# Patient Record
Sex: Female | Born: 1935 | Race: White | Hispanic: No | Smoking: Former smoker
Health system: Southern US, Community
[De-identification: ages and names within clinical notes are randomized; demographics above are authoritative.]

## PROBLEM LIST (undated history)

## (undated) DIAGNOSIS — I10 Essential (primary) hypertension: Secondary | ICD-10-CM

## (undated) DIAGNOSIS — F0390 Unspecified dementia without behavioral disturbance: Secondary | ICD-10-CM

## (undated) DIAGNOSIS — M48061 Spinal stenosis, lumbar region without neurogenic claudication: Secondary | ICD-10-CM

## (undated) DIAGNOSIS — M1990 Unspecified osteoarthritis, unspecified site: Secondary | ICD-10-CM

## (undated) DIAGNOSIS — K5790 Diverticulosis of intestine, part unspecified, without perforation or abscess without bleeding: Secondary | ICD-10-CM

## (undated) DIAGNOSIS — N281 Cyst of kidney, acquired: Secondary | ICD-10-CM

## (undated) DIAGNOSIS — I4510 Unspecified right bundle-branch block: Secondary | ICD-10-CM

## (undated) DIAGNOSIS — M51369 Other intervertebral disc degeneration, lumbar region without mention of lumbar back pain or lower extremity pain: Secondary | ICD-10-CM

## (undated) DIAGNOSIS — E119 Type 2 diabetes mellitus without complications: Secondary | ICD-10-CM

## (undated) DIAGNOSIS — M5136 Other intervertebral disc degeneration, lumbar region: Secondary | ICD-10-CM

## (undated) DIAGNOSIS — J189 Pneumonia, unspecified organism: Secondary | ICD-10-CM

## (undated) DIAGNOSIS — R1031 Right lower quadrant pain: Principal | ICD-10-CM

## (undated) HISTORY — DX: Right lower quadrant pain: R10.31

## (undated) HISTORY — PX: INNER EAR SURGERY: SHX679

## (undated) HISTORY — DX: Unspecified osteoarthritis, unspecified site: M19.90

## (undated) HISTORY — PX: ABDOMINAL HYSTERECTOMY: SHX81

---

## 2001-09-19 ENCOUNTER — Ambulatory Visit (HOSPITAL_COMMUNITY): Admission: RE | Admit: 2001-09-19 | Discharge: 2001-09-19 | Payer: Self-pay | Admitting: Family Medicine

## 2001-09-19 ENCOUNTER — Encounter: Payer: Self-pay | Admitting: Family Medicine

## 2003-02-21 ENCOUNTER — Encounter: Payer: Self-pay | Admitting: Family Medicine

## 2003-02-21 ENCOUNTER — Ambulatory Visit (HOSPITAL_COMMUNITY): Admission: RE | Admit: 2003-02-21 | Discharge: 2003-02-21 | Payer: Self-pay | Admitting: Family Medicine

## 2005-12-25 ENCOUNTER — Encounter: Payer: Self-pay | Admitting: Orthopedic Surgery

## 2006-01-07 ENCOUNTER — Encounter: Payer: Self-pay | Admitting: Orthopedic Surgery

## 2012-02-20 ENCOUNTER — Emergency Department (HOSPITAL_COMMUNITY)
Admission: EM | Admit: 2012-02-20 | Discharge: 2012-02-21 | Disposition: A | Discharge status: Home / Self Care | Payer: Medicare PPO | Attending: Emergency Medicine | Admitting: Emergency Medicine

## 2012-02-20 ENCOUNTER — Emergency Department (HOSPITAL_COMMUNITY): Payer: Medicare PPO

## 2012-02-20 ENCOUNTER — Encounter (HOSPITAL_COMMUNITY): Payer: Self-pay | Admitting: *Deleted

## 2012-02-20 DIAGNOSIS — M795 Residual foreign body in soft tissue: Secondary | ICD-10-CM

## 2012-02-20 DIAGNOSIS — X58XXXA Exposure to other specified factors, initial encounter: Secondary | ICD-10-CM | POA: Insufficient documentation

## 2012-02-20 DIAGNOSIS — Y93H2 Activity, gardening and landscaping: Secondary | ICD-10-CM | POA: Insufficient documentation

## 2012-02-20 DIAGNOSIS — S61209A Unspecified open wound of unspecified finger without damage to nail, initial encounter: Secondary | ICD-10-CM | POA: Insufficient documentation

## 2012-02-20 DIAGNOSIS — Z1833 Retained wood fragments: Secondary | ICD-10-CM | POA: Insufficient documentation

## 2012-02-20 DIAGNOSIS — Z23 Encounter for immunization: Secondary | ICD-10-CM | POA: Insufficient documentation

## 2012-02-20 MED ORDER — TETANUS-DIPHTH-ACELL PERTUSSIS 5-2.5-18.5 LF-MCG/0.5 IM SUSP
0.5000 mL | Freq: Once | INTRAMUSCULAR | Status: AC
  Administered 2012-02-20: 0.5 mL via INTRAMUSCULAR
  Filled 2012-02-20: qty 0.5

## 2012-02-20 NOTE — ED Notes (Signed)
Pt waiting on dr. Hilda Lias to call

## 2012-02-20 NOTE — ED Notes (Signed)
Pt pulling on a vine at home and and vine snapped the other way and pt had a tight grip on the vine and now has part of vine in right third finger

## 2012-02-21 MED ORDER — CEPHALEXIN 500 MG PO CAPS: 1000.0000 mg | ORAL_CAPSULE | Freq: Once | ORAL | Status: DC

## 2012-02-21 MED ORDER — CEPHALEXIN 250 MG/5ML PO SUSR: 500.0000 mg | Freq: Once | ORAL | Status: DC

## 2012-02-21 MED ORDER — LIDOCAINE HCL (PF) 1 % IJ SOLN
INTRAMUSCULAR | Status: AC
  Filled 2012-02-21: qty 10

## 2012-02-21 MED ORDER — CEPHALEXIN 500 MG PO CAPS
ORAL_CAPSULE | ORAL | Status: AC
  Filled 2012-02-21: qty 2

## 2012-02-21 NOTE — Consult Note (Signed)
Reason for Consult: Foreign body right long finger Referring Physician: ER  Haley Nguyen is an 78 y.o. female.  HPI: She was working with grape vine around 4:30 pm yesterday afternoon.  She had a splinter from the vine enter her right dominant ring finger.  It went into the middle phalanx area between the DIP and PIP joints.  It exited.  The family pulled it and only a part of it came out and the rest broke off under the skin.  They came here after a while at home debating what to do.  She has no other injury.  She has no numbness. She is very nervous about it.  History reviewed. No pertinent past medical history.  Past Surgical History  Procedure Date  . Abdominal hysterectomy   . Breast surgery   . Brain surgery     History reviewed. No pertinent family history.  Social History:  reports that she has quit smoking. Her smoking use included Cigarettes. She does not have any smokeless tobacco history on file. She reports that she does not drink alcohol or use illicit drugs.  Allergies: No Known Allergies  Medications: I have reviewed the patient's current medications.  No results found for this or any previous visit (from the past 48 hour(s)).  Dg Finger Middle Right  02/21/2012  *RADIOLOGY REPORT*  Clinical Data: Swelling/laceration to right middle finger  RIGHT MIDDLE FINGER 2+V  Comparison: None.  Findings: No fracture or dislocation is seen.  Mild degenerative changes at the DIP joint.  Mild soft tissue swelling.  No radiopaque foreign body is seen.  IMPRESSION: No fracture, dislocation, or radiopaque foreign body is seen.  Mild soft tissue swelling.  Original Report Authenticated By: Charline Bills, M.D.    Review of Systems  Constitutional: Negative.   HENT: Negative.   Eyes: Negative.   Respiratory: Negative.   Cardiovascular: Negative.   Gastrointestinal: Negative.   Genitourinary: Negative.   Musculoskeletal: Positive for joint pain (She has foreign body right  long finger from grape vine spine).  Skin: Negative.   Neurological: Negative.   Psychiatric/Behavioral: Negative.    Blood pressure 146/54, pulse 70, temperature 97.9 F (36.6 C), temperature source Oral, resp. rate 16, height 5\' 6"  (1.676 m), weight 77.111 kg (170 lb), SpO2 100.00%. Physical Exam  Constitutional: She is oriented to person, place, and time. She appears well-developed and well-nourished.  HENT:  Head: Normocephalic.  Eyes: Conjunctivae and EOM are normal. Pupils are equal, round, and reactive to light.  Neck: Normal range of motion. Neck supple.  Cardiovascular: Normal rate and regular rhythm.   Respiratory: Effort normal and breath sounds normal.  GI: Soft.  Musculoskeletal: She exhibits tenderness (right long finger volar side between PIP and DIP joints with puncture wounds and swelling and tenderness.  Impression of something  deep under skin is present.  NV is intact.  ROM is painful.  She is very upset.).       Hands: Neurological: She is alert and oriented to person, place, and time. She has normal reflexes.  Skin: Skin is warm and dry.  Psychiatric: She has a normal mood and affect. Her behavior is normal. Judgment and thought content normal.    Assessment/Plan: Foreign body of the right long finger volar side.  I explained need to remove the foreign body.  I explained the procedure.  She appeared to understand.  Family members were present.  Sterile prep and drape was done.  1% Xylocaine digital block of the long  finger on the right was done.  A finger tourniquet was applied.  I made an incision on the volar side of the finger between both puncture marks.  With careful dissection, foreign material of wood looking substance seen.  Several pieces were removed.  One was very large, the others, deep next to the tendon, were removed.  The finger was debrieded.  It was washed with peroxide and betadine.  The wound was closed with 3-0 nylon.  A sterile dressing applied  then a dressing of tube gauze applied.  She tolerated it well.  I have told her that a small piece of wood could still remain and the finger may "fester up" later.  If so, she would need further surgery.  She understands.  I will give Keflex tonight.  She has Tramadol at home and she will use this for pain.  I will see her in my office Monday.  Call for appointment.  Return to the ER if any problem.  Rx for Keflex given also.  She understands.  Haley Nguyen 02/21/2012, 1:25 AM

## 2012-02-21 NOTE — ED Provider Notes (Signed)
Medical screening examination/treatment/procedure(s) were conducted as a shared visit with non-physician practitioner(s) and myself.  I personally evaluated the patient during the encounter  Patient seen by me clinically evidence of a woody foreign body deep into the right middle finger. Orthopedics called in consultation Dr. Hilda Lias will come in and remove the foreign body.  Shelda Jakes, MD 02/21/12 [phone]

## 2012-02-21 NOTE — ED Notes (Signed)
Dr. Hilda Lias coming to see pt.

## 2012-02-21 NOTE — ED Provider Notes (Signed)
History     CSN: [id number]  Arrival date & time 02/20/12  1948   First MD Initiated Contact with Patient 02/20/12 2213      Chief Complaint  Patient presents with  . Foreign Body    (Consider location/radiation/quality/duration/timing/severity/associated sxs/prior treatment) HPI Comments: Patient c/o probable wooden foreign body to the right middle finger that occurred earlier today while pulling on grape vines.  Family member tried to remove it but states the "end broke off".  Pain is worse with movement of the finger.  She denies numbness of the finger.    Patient is a 78 y.o. female presenting with foreign body. The history is provided by the patient.  Foreign Body  The current episode started 6 to 12 hours ago. Intake: in the soft tissue of the finger. Suspected object: piece of wood. The incident was witnessed/reported by the patient. Pertinent negatives include no fever. She has been behaving normally. There were no sick contacts. She has received no recent medical care.    History reviewed. No pertinent past medical history.  Past Surgical History  Procedure Date  . Abdominal hysterectomy   . Breast surgery   . Brain surgery     History reviewed. No pertinent family history.  History  Substance Use Topics  . Smoking status: Former Smoker    Types: Cigarettes  . Smokeless tobacco: Not on file  . Alcohol Use: No    OB History    Grav Para Term Preterm Abortions TAB SAB Ect Mult Living                  Review of Systems  Constitutional: Negative for fever.  Skin: Positive for color change and wound.  Neurological: Negative for weakness and numbness.  Hematological: Does not bruise/bleed easily.  All other systems reviewed and are negative.    Allergies  Review of patient's allergies indicates no known allergies.  Home Medications  No current outpatient prescriptions on file.  BP 145/53  Pulse 64  Temp(Src) 97.9 F (36.6 C) (Oral)  Resp 16  Ht 5'  6" (1.676 m)  Wt 170 lb (77.111 kg)  BMI 27.44 kg/m2  SpO2 99%  Physical Exam  Nursing note and vitals reviewed. Constitutional: She is oriented to person, place, and time. She appears well-developed and well-nourished. No distress.  HENT:  Head: Normocephalic and atraumatic.  Cardiovascular: Normal rate, regular rhythm, normal heart sounds and intact distal pulses.   No murmur heard. Pulmonary/Chest: Effort normal and breath sounds normal.  Musculoskeletal: She exhibits tenderness. She exhibits no edema.       Right hand: She exhibits tenderness. She exhibits normal range of motion, no bony tenderness, normal two-point discrimination, normal capillary refill, no deformity and no swelling. normal sensation noted. Normal strength noted.       Hands:      Palpable FB to the volar surface of right middle finger that appears to be in the deep structures of the finger at the area between the PIP and DIP joints.  No active bleeding  Neurological: She is alert and oriented to person, place, and time. She exhibits normal muscle tone. Coordination normal.  Skin:       See MS exam    ED Course  Procedures (including critical care time)Dg Finger Middle Right  02/21/2012  *RADIOLOGY REPORT*  Clinical Data: Swelling/laceration to right middle finger  RIGHT MIDDLE FINGER 2+V  Comparison: None.  Findings: No fracture or dislocation is seen.  Mild degenerative changes  at the DIP joint.  Mild soft tissue swelling.  No radiopaque foreign body is seen.  IMPRESSION: No fracture, dislocation, or radiopaque foreign body is seen.  Mild soft tissue swelling.  Original Report Authenticated By: Charline Bills, M.D.        MDM    Td has been updated tonight.     Palpable FB to the right middle finger, seems within the deep tissues.  Mild localized erythema present.  Distal sensation intact, CR< 2 sec.  Pt also seen by EDP and care plan discussed.  Will consult ortho.      I have consulted Dr. Hilda Lias  , he agrees to come to ED for further management of this patient.  Andri Prestia L. [place], [place] 02/21/12 [phone]

## 2012-02-21 NOTE — Discharge Instructions (Signed)
Foreign Body  When the skin is cut or punctured and some object is left in the tissue under the skin, that object is called a "foreign body". A foreign body could be a wood splinter, a thorn, a sliver of metal, a shard of glass, a cactus needle or the tip of a pencil. In most instances, your caregiver will recommend that the foreign body be removed. If it is not removed, infection, abscess formation, an allergic reaction, chronic pain and disability can occur over time.  Sometimes, foreign bodies (particularly very small ones) can be difficult to locate. Your caregiver may recommend x-rays or ultrasound imaging to help find them. If removal is not successful, there may be a need to see a surgeon who might suggest further exploration in the operating room. Occasionally, tiny bits of metallic foreign material (such as shrapnel) are not removed, if it is felt that there would be no harm in leaving them untouched.  HOME CARE INSTRUCTIONS   Rest the injured area and keep it elevated until all the pain and swelling are gone.   You will need a tetanus vaccination if you have not had one in the last 5 years.   Return to this facility, see your caregiver or follow-up as instructed in 2 days.  SEEK IMMEDIATE MEDICAL CARE IF:    You develop increasing redness or swelling of the skin near the wound.   You develop drainage of pus from the wound.   You have persistent pain or loss of motion.   You have red streaks extending above or below the wound location.  MAKE SURE YOU:    Understand these instructions.   Will watch your condition.   Will get help right away if you are not doing well or get worse.  Document Released: 10/26/2005 Document Revised: 10/15/2011 Document Reviewed: 09/27/2009  ExitCare Patient Information 2012 [employer].

## 2012-12-26 ENCOUNTER — Other Ambulatory Visit (HOSPITAL_COMMUNITY): Payer: Self-pay | Admitting: *Deleted

## 2012-12-26 DIAGNOSIS — R109 Unspecified abdominal pain: Secondary | ICD-10-CM

## 2012-12-29 ENCOUNTER — Other Ambulatory Visit (HOSPITAL_COMMUNITY): Payer: Medicare PPO

## 2013-01-05 ENCOUNTER — Other Ambulatory Visit (HOSPITAL_COMMUNITY): Payer: Self-pay | Admitting: *Deleted

## 2013-01-05 ENCOUNTER — Ambulatory Visit (HOSPITAL_COMMUNITY)
Admission: RE | Admit: 2013-01-05 | Discharge: 2013-01-05 | Disposition: A | Discharge status: Home / Self Care | Payer: Medicare PPO | Source: Ambulatory Visit | Attending: Family Medicine | Admitting: Family Medicine

## 2013-01-05 DIAGNOSIS — R935 Abnormal findings on diagnostic imaging of other abdominal regions, including retroperitoneum: Secondary | ICD-10-CM | POA: Insufficient documentation

## 2013-01-05 DIAGNOSIS — N83209 Unspecified ovarian cyst, unspecified side: Secondary | ICD-10-CM | POA: Insufficient documentation

## 2013-01-05 DIAGNOSIS — R109 Unspecified abdominal pain: Secondary | ICD-10-CM

## 2013-01-05 DIAGNOSIS — R1031 Right lower quadrant pain: Secondary | ICD-10-CM | POA: Insufficient documentation

## 2013-02-03 ENCOUNTER — Encounter: Payer: Self-pay | Admitting: Obstetrics & Gynecology

## 2013-02-06 ENCOUNTER — Ambulatory Visit (INDEPENDENT_AMBULATORY_CARE_PROVIDER_SITE_OTHER): Payer: 59 | Admitting: Obstetrics & Gynecology

## 2013-02-06 ENCOUNTER — Encounter: Payer: Self-pay | Admitting: Obstetrics & Gynecology

## 2013-02-06 VITALS — BP 170/90 | Ht 65.0 in | Wt 176.0 lb

## 2013-02-06 DIAGNOSIS — N83209 Unspecified ovarian cyst, unspecified side: Secondary | ICD-10-CM | POA: Insufficient documentation

## 2013-02-06 DIAGNOSIS — R1031 Right lower quadrant pain: Secondary | ICD-10-CM | POA: Insufficient documentation

## 2013-02-06 NOTE — Patient Instructions (Signed)
Ovarian Tumors  The ovaries are small organs that produce eggs in women. They lie on each side of the uterus. Tumors are solid growths on the ovary, not like ovarian cysts that are filled with fluid. They can be cancerous or noncancerous. All solid tumors should be looked at to make sure they are not cancer tumors.   CAUSES   There are no known causes for developing a solid tumor on the ovary. However, there are several risk factors for developing cancerous tumors on the ovary, such as:   Aging.   North American or North European descent.   Personal or family history of ovarian, colon and breast cancer.   Women with BRCA 1 or BRCA 2 genes are at high risk for getting ovarian cancer.   The use of fertility medications to get pregnant may increase the risk for getting ovarian cancer.   Late menopause (after age 50).   Women who become pregnant for the first time at 30 or older.  Having these risk factors does not mean you will get ovarian cancer. However, you should know about them and report any that you have to your caregiver. Also, a woman with none of these risk factors can still get ovarian cancer.  SYMPTOMS   In many cases there are no symptoms. Noncancerous tumors usually have no symptoms but cancerous tumors may have symptoms that are minor and resemble other health problems. The following are symptoms that may be important to diagnosing cancer of the ovary:   Unexplained weight loss.   Increase abdominal size.   Pain in the belly (abdomen).   Pain or pressure in the back and pelvis.   Tiredness.   Abnormal vaginal bleeding.   Loss of appetite.   Frequent urination or pressure on your bladder.   Indigestion, increase gas and bloating.   Painful sexual intercourse.  DIAGNOSIS    During an exam, an abnormal mass may be found in the pelvis. It is important to have a rectovaginal examination to help find pelvic masses, especially in women over 40 years old.   An ultrasound may be done.   X-ray,  CT scan or MRI imaging.   Blood tests.   A Pap test does not help in diagnosing tumors or cancer of the ovary. New screening tests are always being studied to detect early ovarian cancer.  TREATMENT    All solid tumors of the ovary should be evaluated, usually with surgery, to make sure they are not cancerous.   The tumor will be studied in the lab under the microscope to see if it is cancer.   Noncancerous tumors can be removed surgically with or without removing the ovary.   Cancerous tumors usually are removed with the ovary and sometimes both ovaries are removed with the Fallopian tubes, uterus and surrounding lymph nodes to see if the cancer has spread.   Cancerous tumors may also be treated along with the surgery with radiation, chemotherapy or both.   The surgeon should be a gynecology oncologist (cancer specialist in gynecology cancer surgery) and the chemotherapist and radiation therapist should be experienced specialists in their field.  HOME CARE INSTRUCTIONS    Inform your caregiver if you or anyone in your family has had cancer.   Follow your caregiver's advice and recommendations regarding medications and follow up care.   Get a yearly physical and gynecology exams. This includes a rectovaginal exam if you are 40 years old or older.  SEEK MEDICAL CARE IF:      You have any of the above symptoms that have not gone away after a week of treatment.   You are losing weight for no reason.   You feel generally ill.  Document Released: 08/04/2008 Document Revised: 01/18/2012 Document Reviewed: 08/04/2008  ExitCare Patient Information 2013 [employer].

## 2013-02-06 NOTE — Progress Notes (Signed)
Patient ID: Haley Nguyen, female   DOB: [birth year], 79 y.o.   MRN: [id number] .  Haley Nguyen is in today for evaluation for a right pelvic mass.  She was evaluated at [facility] family practice and subsequently had a pelvic sonogram which has revealed a minimally complex right ovarian cyst measuring approximately 4 cm.  The left ovaries normal.  There is no free fluid.  There are no excrescences or thick symptoms septations. The patient is status post hysterectomy.  Given the sonographic findings I'm going to proceed with a CA 125 evaluation and see the patient back next week to cover those findings  If the CA 125 is normal our proceed with a laparoscopic right and left salpingo-oophorectomy.  The patient her granddaughter understand the indications for surgery and will proceed.  No exam was indicated today but we will see her preoperatively next week and perform her exam at that time.  Maela Takeda H 12:13 PM 02/06/2013

## 2013-02-13 ENCOUNTER — Encounter: Payer: Self-pay | Admitting: Obstetrics & Gynecology

## 2013-02-13 ENCOUNTER — Ambulatory Visit (INDEPENDENT_AMBULATORY_CARE_PROVIDER_SITE_OTHER): Payer: 59 | Admitting: Obstetrics & Gynecology

## 2013-02-13 VITALS — BP 158/70 | Ht 66.0 in | Wt 178.0 lb

## 2013-02-13 DIAGNOSIS — N83209 Unspecified ovarian cyst, unspecified side: Secondary | ICD-10-CM

## 2013-02-13 DIAGNOSIS — R1031 Right lower quadrant pain: Secondary | ICD-10-CM

## 2013-02-13 NOTE — Progress Notes (Signed)
Patient ID: Haley Nguyen, female   DOB: 1933/03/20, 79 y.o.   MRN: [id number] See previous note  Ca125 normal.  I talked extensively with patient and grand daughter about overall clinical status.  Agree with proceeding with laparoscopic removal of both tubes and ovaries  All questions answered.  Nolton Denis H 02/13/2013 12:16 PM

## 2013-02-13 NOTE — Patient Instructions (Addendum)
Ovarian Cyst The ovaries are small organs that are on each side of the uterus. The ovaries are the organs that produce the female hormones, estrogen and progesterone. An ovarian cyst is a sac filled with fluid that can vary in its size. It is normal for a small cyst to form in women who are in the childbearing age and who have menstrual periods. This type of cyst is called a follicle cyst that becomes an ovulation cyst (corpus luteum cyst) after it produces the women's egg. It later goes away on its own if the woman does not become pregnant. There are other kinds of ovarian cysts that may cause problems and may need to be treated. The most serious problem is a cyst with cancer. It should be noted that menopausal women who have an ovarian cyst are at a higher risk of it being a cancer cyst. They should be evaluated very quickly, thoroughly and followed closely. This is especially true in menopausal women because of the high rate of ovarian cancer in women in menopause. CAUSES AND TYPES OF OVARIAN CYSTS:  FUNCTIONAL CYST: The follicle/corpus luteum cyst is a functional cyst that occurs every month during ovulation with the menstrual cycle. They go away with the next menstrual cycle if the woman does not get pregnant. Usually, there are no symptoms with a functional cyst.  ENDOMETRIOMA CYST: This cyst develops from the lining of the uterus tissue. This cyst gets in or on the ovary. It grows every month from the bleeding during the menstrual period. It is also called a "chocolate cyst" because it becomes filled with blood that turns brown. This cyst can cause pain in the lower abdomen during intercourse and with your menstrual period.  CYSTADENOMA CYST: This cyst develops from the cells on the outside of the ovary. They usually are not cancerous. They can get very big and cause lower abdomen pain and pain with intercourse. This type of cyst can twist on itself, cut off its blood supply and cause severe pain. It  also can easily rupture and cause a lot of pain.  DERMOID CYST: This type of cyst is sometimes found in both ovaries. They are found to have different kinds of body tissue in the cyst. The tissue includes skin, teeth, hair, and/or cartilage. They usually do not have symptoms unless they get very big. Dermoid cysts are rarely cancerous.  POLYCYSTIC OVARY: This is a rare condition with hormone problems that produces many small cysts on both ovaries. The cysts are follicle-like cysts that never produce an egg and become a corpus luteum. It can cause an increase in body weight, infertility, acne, increase in body and facial hair and lack of menstrual periods or rare menstrual periods. Many women with this problem develop type 2 diabetes. The exact cause of this problem is unknown. A polycystic ovary is rarely cancerous.  THECA LUTEIN CYST: Occurs when too much hormone (human chorionic gonadotropin) is produced and over-stimulates the ovaries to produce an egg. They are frequently seen when doctors stimulate the ovaries for invitro-fertilization (test tube babies).  LUTEOMA CYST: This cyst is seen during pregnancy. Rarely it can cause an obstruction to the birth canal during labor and delivery. They usually go away after delivery. SYMPTOMS   Pelvic pain or pressure.  Pain during sexual intercourse.  Increasing girth (swelling) of the abdomen.  Abnormal menstrual periods.  Increasing pain with menstrual periods.  You stop having menstrual periods and you are not pregnant. DIAGNOSIS  The diagnosis can   be made during:  Routine or annual pelvic examination (common).  Ultrasound.  X-ray of the pelvis.  CT Scan.  MRI.  Blood tests. TREATMENT   Treatment may only be to follow the cyst monthly for 2 to 3 months with your caregiver. Many go away on their own, especially functional cysts.  May be aspirated (drained) with a long needle with ultrasound, or by laparoscopy (inserting a tube into  the pelvis through a small incision).  The whole cyst can be removed by laparoscopy.  Sometimes the cyst may need to be removed through an incision in the lower abdomen.  Hormone treatment is sometimes used to help dissolve certain cysts.  Birth control pills are sometimes used to help dissolve certain cysts. HOME CARE INSTRUCTIONS  Follow your caregiver's advice regarding:  Medicine.  Follow up visits to evaluate and treat the cyst.  You may need to come back or make an appointment with another caregiver, to find the exact cause of your cyst, if your caregiver is not a gynecologist.  Get your yearly and recommended pelvic examinations and Pap tests.  Let your caregiver know if you have had an ovarian cyst in the past. SEEK MEDICAL CARE IF:   Your periods are late, irregular, they stop, or are painful.  Your stomach (abdomen) or pelvic pain does not go away.  Your stomach becomes larger or swollen.  You have pressure on your bladder or trouble emptying your bladder completely.  You have painful sexual intercourse.  You have feelings of fullness, pressure, or discomfort in your stomach.  You lose weight for no apparent reason.  You feel generally ill.  You become constipated.  You lose your appetite.  You develop acne.  You have an increase in body and facial hair.  You are gaining weight, without changing your exercise and eating habits.  You think you are pregnant. SEEK IMMEDIATE MEDICAL CARE IF:   You have increasing abdominal pain.  You feel sick to your stomach (nausea) and/or vomit.  You develop a fever that comes on suddenly.  You develop abdominal pain during a bowel movement.  Your menstrual periods become heavier than usual. Document Released: 10/26/2005 Document Revised: 01/18/2012 Document Reviewed: 08/29/2009 ExitCare Patient Information 2013 [employer].  

## 2013-02-22 NOTE — Patient Instructions (Addendum)
Haley Nguyen  02/22/2013   Your procedure is scheduled on:  03/02/2013  Report to [facility] at  615  AM.  Call this number if you have problems the morning of surgery: [phone]   Remember:   Do not eat food or drink liquids after midnight.   Take these medicines the morning of surgery with A SIP OF WATER: norvasc,zyrtec,metoprolol,ultram   Do not wear jewelry, make-up or nail polish.  Do not wear lotions, powders, or perfumes.   Do not shave 48 hours prior to surgery. Men may shave face and neck.  Do not bring valuables to the hospital.  Contacts, dentures or bridgework may not be worn into surgery.  Leave suitcase in the car. After surgery it may be brought to your room.  For patients admitted to the hospital, checkout time is 11:00 AM the day of discharge.   Patients discharged the day of surgery will not be allowed to drive  home.  Name and phone number of your driver: family  Special Instructions: Shower using CHG 2 nights before surgery and the night before surgery.  If you shower the day of surgery use CHG.  Use special wash - you have one bottle of CHG for all showers.  You should use approximately 1/3 of the bottle for each shower.   Please read over the following fact sheets that you were given: Pain Booklet, Coughing and Deep Breathing, MRSA Information, Surgical Site Infection Prevention, Anesthesia Post-op Instructions and Care and Recovery After Surgery  Salpingo-Oophorectomy Unilateral salpingo-oophorectomy is the removal of one fallopian tube and ovary. The fallopian tubes transport the egg from the ovary to the womb (uterus). The fallopian tube is also where the sperm and egg meet and become fertilized and move down into the uterus.  Removing one tube and ovary will not:  Cause problems with your menstrual periods.  Cause problems with your sex drive (libido).  Put you into the menopause.  Give symptoms of the menopause.  Make you not able to get  pregnant (sterile). There are several reasons for doing a salpingo-oophorectomy:  Infection of the tube and ovary.  There may be scar tissue of the tube and ovary (adhesions).  It may be necessary to remove the tube when the ovary has a cyst or tumor.  It may have to be done when removing the uterus.  It may have to be done when there is cancer of the tube or ovary. LET YOUR CAREGIVER KNOW ABOUT:  Allergies to food or medications.  All the medications you are taking including prescription and over-the-counter herbs, eye drops and creams.  If you are using illegal drugs or excessive alcohol.  Your smoking habits.  Previous problems with anesthesia including numbing medication.  The possibility of being pregnant.  History of blood clots or bleeding problems.  Previous surgery.  Any other medical or health problems. RISKS AND COMPLICATIONS  All surgery is associated with risks. Some of these risks are:  Injury to surrounding organs.  Bleeding.  Infection.  Blood clots in the legs or lungs.  Problems with the anesthesia.  The surgery does not help the problem.  Death. BEFORE THE PROCEDURE  Do not take aspirin or blood thinners because it can make you bleed.  Do not eat or drink anything at least 8 hours before the surgery.  Let your caregiver know if you develop a cold or an infection.  If you are being admitted the day of surgery,  arrive at least one hour before the surgery.  Arrange for help when you go home from the hospital.  If you smoke, do not smoke for at least 2 weeks before the surgery. PROCEDURE After being admitted to the hospital, you will change into a hospital gown. Then, you will be given an IV (intravenous) and a medication to relax you. Then, you will be put to sleep with an anesthetic. Any hair on your lower belly (abdomen) will be removed, and a catheter will be placed in your bladder. The fallopian tube and ovary will be removed either  through 2 very small cuts (incisions) or through large incision in the lower abdomen. The blood vessels will be clamped and tied. AFTER THE PROCEDURE  You will be taken to the recovery room for 1 to 3 hours until your blood pressure, pulse and temperature are stable and you are waking up.  If you had a laparoscopy, you may be discharged in several hours.  If you had a large incision, you will be admitted to the hospital for a day or two.  If you had a laparoscopy, you may have shoulder pain. This is not unusual. It is from air that is left in the abdomen and affects the nerve that goes from the diaphragm to the shoulder. It goes away in a day or two.  You will be given pain medication as necessary.  The intravenous and catheter will be removed before you are discharged.  Have someone available to take you home. HOME CARE INSTRUCTIONS   It is normal to be sore for a week or two. Call your caregiver if the pain is getting worse or the pain medication is not helping.  Have help when you go home for a week or so to help with the household chores.  Follow your caregiver's advice regarding diet.  Get rest and sleep.  Only take over-the-counter or prescription medicines for pain or discomfort as directed by your caregiver.  Do not take aspirin. It can cause bleeding.  Do not drive, exercise or lift anything over 5 pounds.  Do not drink alcohol until your caregiver gives you permission.  Do not lift anything over 5 pounds.  Do not have sexual intercourse until your caregiver says it is OK.  Take your temperature twice a day and write it down.  Change the bandage (dressing) as directed.  Make and keep your follow-up appointments for postoperative care.  If you become constipated, ask your caregiver about taking a mild laxative. Drinking more liquids than usual and eating bran foods can help prevent constipation. SEEK MEDICAL CARE IF:   You have swelling or redness around the cut  (incision).  You develop a rash.  You have side effects from the medication.  You feel lightheaded.  You need more or stronger medication.  You have pain, swelling or redness where the IV (intravenous) was placed. SEEK IMMEDIATE MEDICAL CARE IF:   You develop an unexplained temperature above 100 F (37.8 C).  You develop increasing belly (abdominal) pain.  You have pus coming out of the incision.  You notice a bad smell coming from the wound or dressing.  The incision is separating.  There is excessive vaginal bleeding.  You start to feel sick to your stomach (nauseous) and vomit.  You have leg or chest pain.  You have pain when you urinate.  You develop shortness of breath.  You pass out. Document Released: 08/23/2009 Document Revised: 01/18/2012 Document Reviewed: 08/23/2009 ExitCare Patient  Information 2013 [employer], [place]. PATIENT INSTRUCTIONS POST-ANESTHESIA  IMMEDIATELY FOLLOWING SURGERY:  Do not drive or operate machinery for the first twenty four hours after surgery.  Do not make any important decisions for twenty four hours after surgery or while taking narcotic pain medications or sedatives.  If you develop intractable nausea and vomiting or a severe headache please notify your doctor immediately.  FOLLOW-UP:  Please make an appointment with your surgeon as instructed. You do not need to follow up with anesthesia unless specifically instructed to do so.  WOUND CARE INSTRUCTIONS (if applicable):  Keep a dry clean dressing on the anesthesia/puncture wound site if there is drainage.  Once the wound has quit draining you may leave it open to air.  Generally you should leave the bandage intact for twenty four hours unless there is drainage.  If the epidural site drains for more than 36-48 hours please call the anesthesia department.  QUESTIONS?:  Please feel free to call your physician or the hospital operator if you have any questions, and they will be happy to  assist you.

## 2013-02-23 ENCOUNTER — Encounter (HOSPITAL_COMMUNITY)
Admission: RE | Admit: 2013-02-23 | Discharge: 2013-02-23 | Disposition: A | Discharge status: Home / Self Care | Payer: Medicare PPO | Source: Ambulatory Visit | Attending: Obstetrics & Gynecology | Admitting: Obstetrics & Gynecology

## 2013-02-23 ENCOUNTER — Encounter (HOSPITAL_COMMUNITY): Payer: Self-pay | Admitting: Pharmacy Technician

## 2013-02-23 ENCOUNTER — Other Ambulatory Visit: Payer: Self-pay | Admitting: Obstetrics & Gynecology

## 2013-02-23 ENCOUNTER — Encounter (HOSPITAL_COMMUNITY): Payer: Self-pay

## 2013-02-23 ENCOUNTER — Other Ambulatory Visit: Payer: Self-pay

## 2013-02-23 LAB — COMPREHENSIVE METABOLIC PANEL
ALT: 15 U/L (ref 0–35)
AST: 20 U/L (ref 0–37)
Albumin: 4.1 g/dL (ref 3.5–5.2)
Calcium: 10.1 mg/dL (ref 8.4–10.5)
Sodium: 138 mEq/L (ref 135–145)
Total Protein: 7.6 g/dL (ref 6.0–8.3)

## 2013-02-23 LAB — URINALYSIS, ROUTINE W REFLEX MICROSCOPIC
Bilirubin Urine: NEGATIVE
Hgb urine dipstick: NEGATIVE
Nitrite: NEGATIVE
Specific Gravity, Urine: 1.01 (ref 1.005–1.030)
pH: 6.5 (ref 5.0–8.0)

## 2013-02-23 LAB — CBC
MCH: 29.2 pg (ref 26.0–34.0)
MCHC: 34.3 g/dL (ref 30.0–36.0)
Platelets: 270 10*3/uL (ref 150–400)

## 2013-02-23 LAB — SURGICAL PCR SCREEN: Staphylococcus aureus: NEGATIVE

## 2013-02-28 NOTE — Pre-Procedure Instructions (Signed)
Dr Jayme Cloud told of potassium of 5.6. Rest of comp panel ok,thinks specimen may be hemalyzed, will recheck istat on arrival.

## 2013-03-02 ENCOUNTER — Encounter (HOSPITAL_COMMUNITY)
Admission: RE | Disposition: A | Discharge status: Home / Self Care | Payer: Self-pay | Source: Ambulatory Visit | Attending: Obstetrics & Gynecology

## 2013-03-02 ENCOUNTER — Encounter (HOSPITAL_COMMUNITY): Payer: Self-pay | Admitting: *Deleted

## 2013-03-02 ENCOUNTER — Ambulatory Visit (HOSPITAL_COMMUNITY): Payer: Medicare PPO | Admitting: Anesthesiology

## 2013-03-02 ENCOUNTER — Ambulatory Visit (HOSPITAL_COMMUNITY)
Admission: RE | Admit: 2013-03-02 | Discharge: 2013-03-02 | Disposition: A | Discharge status: Home / Self Care | Payer: Medicare PPO | Source: Ambulatory Visit | Attending: Obstetrics & Gynecology | Admitting: Obstetrics & Gynecology

## 2013-03-02 ENCOUNTER — Encounter (HOSPITAL_COMMUNITY): Payer: Self-pay | Admitting: Anesthesiology

## 2013-03-02 DIAGNOSIS — Z01810 Encounter for preprocedural cardiovascular examination: Secondary | ICD-10-CM | POA: Insufficient documentation

## 2013-03-02 DIAGNOSIS — I10 Essential (primary) hypertension: Secondary | ICD-10-CM | POA: Insufficient documentation

## 2013-03-02 DIAGNOSIS — K660 Peritoneal adhesions (postprocedural) (postinfection): Secondary | ICD-10-CM | POA: Insufficient documentation

## 2013-03-02 DIAGNOSIS — N9489 Other specified conditions associated with female genital organs and menstrual cycle: Secondary | ICD-10-CM

## 2013-03-02 DIAGNOSIS — Z01812 Encounter for preprocedural laboratory examination: Secondary | ICD-10-CM | POA: Insufficient documentation

## 2013-03-02 DIAGNOSIS — Z9889 Other specified postprocedural states: Secondary | ICD-10-CM

## 2013-03-02 DIAGNOSIS — N839 Noninflammatory disorder of ovary, fallopian tube and broad ligament, unspecified: Secondary | ICD-10-CM | POA: Insufficient documentation

## 2013-03-02 DIAGNOSIS — N736 Female pelvic peritoneal adhesions (postinfective): Secondary | ICD-10-CM

## 2013-03-02 DIAGNOSIS — R1031 Right lower quadrant pain: Secondary | ICD-10-CM | POA: Insufficient documentation

## 2013-03-02 HISTORY — PX: LAPAROSCOPIC SALPINGO OOPHERECTOMY: SHX5927

## 2013-03-02 HISTORY — PX: LAPAROSCOPIC LYSIS OF ADHESIONS: SHX5905

## 2013-03-02 LAB — POCT I-STAT 4, (NA,K, GLUC, HGB,HCT)
Glucose, Bld: 107 mg/dL — ABNORMAL HIGH (ref 70–99)
HCT: 47 % — ABNORMAL HIGH (ref 36.0–46.0)
Sodium: 141 mEq/L (ref 135–145)

## 2013-03-02 SURGERY — SALPINGO-OOPHORECTOMY, LAPAROSCOPIC
Anesthesia: General | Site: Abdomen | Wound class: Clean Contaminated

## 2013-03-02 MED ORDER — LACTATED RINGERS IV SOLN
INTRAVENOUS | Status: DC
  Administered 2013-03-02: 1000 mL via INTRAVENOUS

## 2013-03-02 MED ORDER — ONDANSETRON HCL 4 MG/2ML IJ SOLN: 4.0000 mg | Freq: Once | INTRAMUSCULAR | Status: DC | PRN

## 2013-03-02 MED ORDER — KETOROLAC TROMETHAMINE 10 MG PO TABS: 10.0000 mg | ORAL_TABLET | Freq: Three times a day (TID) | ORAL | Status: DC | PRN

## 2013-03-02 MED ORDER — ROCURONIUM BROMIDE 50 MG/5ML IV SOLN
INTRAVENOUS | Status: AC
  Filled 2013-03-02: qty 1

## 2013-03-02 MED ORDER — ONDANSETRON HCL 4 MG/2ML IJ SOLN
4.0000 mg | Freq: Once | INTRAMUSCULAR | Status: AC
  Administered 2013-03-02: 4 mg via INTRAVENOUS

## 2013-03-02 MED ORDER — FENTANYL CITRATE 0.05 MG/ML IJ SOLN
INTRAMUSCULAR | Status: AC
  Filled 2013-03-02: qty 5

## 2013-03-02 MED ORDER — BUPIVACAINE LIPOSOME 1.3 % IJ SUSP
20.0000 mL | Freq: Once | INTRAMUSCULAR | Status: DC
  Filled 2013-03-02: qty 20

## 2013-03-02 MED ORDER — PROPOFOL 10 MG/ML IV BOLUS
INTRAVENOUS | Status: DC | PRN
  Administered 2013-03-02: 100 mg via INTRAVENOUS

## 2013-03-02 MED ORDER — HYDROCODONE-ACETAMINOPHEN 5-325 MG PO TABS: 1.0000 | ORAL_TABLET | Freq: Four times a day (QID) | ORAL | Status: DC | PRN

## 2013-03-02 MED ORDER — MIDAZOLAM HCL 2 MG/2ML IJ SOLN
1.0000 mg | INTRAMUSCULAR | Status: DC | PRN
  Administered 2013-03-02: 2 mg via INTRAVENOUS

## 2013-03-02 MED ORDER — GLYCOPYRROLATE 0.2 MG/ML IJ SOLN
INTRAMUSCULAR | Status: AC
  Filled 2013-03-02: qty 1

## 2013-03-02 MED ORDER — KETOROLAC TROMETHAMINE 30 MG/ML IJ SOLN
30.0000 mg | Freq: Once | INTRAMUSCULAR | Status: AC
  Administered 2013-03-02: 30 mg via INTRAVENOUS

## 2013-03-02 MED ORDER — MIDAZOLAM HCL 5 MG/5ML IJ SOLN
INTRAMUSCULAR | Status: DC | PRN
  Administered 2013-03-02 (×2): 1 mg via INTRAVENOUS

## 2013-03-02 MED ORDER — ONDANSETRON HCL 8 MG PO TABS: 8.0000 mg | ORAL_TABLET | Freq: Three times a day (TID) | ORAL | Status: DC | PRN

## 2013-03-02 MED ORDER — SUCCINYLCHOLINE CHLORIDE 20 MG/ML IJ SOLN
INTRAMUSCULAR | Status: DC | PRN
  Administered 2013-03-02: 140 mg via INTRAVENOUS

## 2013-03-02 MED ORDER — KETOROLAC TROMETHAMINE 30 MG/ML IJ SOLN
INTRAMUSCULAR | Status: AC
  Filled 2013-03-02: qty 1

## 2013-03-02 MED ORDER — ROCURONIUM BROMIDE 100 MG/10ML IV SOLN
INTRAVENOUS | Status: DC | PRN
  Administered 2013-03-02: 5 mg via INTRAVENOUS
  Administered 2013-03-02: 25 mg via INTRAVENOUS

## 2013-03-02 MED ORDER — ONDANSETRON HCL 4 MG/2ML IJ SOLN
INTRAMUSCULAR | Status: AC
  Filled 2013-03-02: qty 2

## 2013-03-02 MED ORDER — 0.9 % SODIUM CHLORIDE (POUR BTL) OPTIME
TOPICAL | Status: DC | PRN
  Administered 2013-03-02: 1000 mL

## 2013-03-02 MED ORDER — GLYCOPYRROLATE 0.2 MG/ML IJ SOLN
INTRAMUSCULAR | Status: DC | PRN
  Administered 2013-03-02: 0.6 mg via INTRAVENOUS

## 2013-03-02 MED ORDER — CEFAZOLIN SODIUM-DEXTROSE 2-3 GM-% IV SOLR
INTRAVENOUS | Status: AC
  Filled 2013-03-02: qty 50

## 2013-03-02 MED ORDER — FENTANYL CITRATE 0.05 MG/ML IJ SOLN: 25.0000 ug | INTRAMUSCULAR | Status: DC | PRN

## 2013-03-02 MED ORDER — NEOSTIGMINE METHYLSULFATE 1 MG/ML IJ SOLN
INTRAMUSCULAR | Status: DC | PRN
  Administered 2013-03-02: 3 mg via INTRAVENOUS

## 2013-03-02 MED ORDER — CEFAZOLIN SODIUM-DEXTROSE 2-3 GM-% IV SOLR: 2.0000 g | INTRAVENOUS | Status: DC

## 2013-03-02 MED ORDER — PROPOFOL 10 MG/ML IV EMUL
INTRAVENOUS | Status: AC
  Filled 2013-03-02: qty 20

## 2013-03-02 MED ORDER — LIDOCAINE HCL 1 % IJ SOLN
INTRAMUSCULAR | Status: DC | PRN
  Administered 2013-03-02: 50 mg via INTRADERMAL

## 2013-03-02 MED ORDER — BUPIVACAINE LIPOSOME 1.3 % IJ SUSP
INTRAMUSCULAR | Status: DC | PRN
  Administered 2013-03-02: 20 mL

## 2013-03-02 MED ORDER — FENTANYL CITRATE 0.05 MG/ML IJ SOLN
INTRAMUSCULAR | Status: DC | PRN
  Administered 2013-03-02 (×2): 50 ug via INTRAVENOUS
  Administered 2013-03-02: 75 ug via INTRAVENOUS
  Administered 2013-03-02: 25 ug via INTRAVENOUS

## 2013-03-02 MED ORDER — EPHEDRINE SULFATE 50 MG/ML IJ SOLN
INTRAMUSCULAR | Status: AC
  Filled 2013-03-02: qty 1

## 2013-03-02 MED ORDER — CEFAZOLIN SODIUM-DEXTROSE 2-3 GM-% IV SOLR
INTRAVENOUS | Status: DC | PRN
  Administered 2013-03-02: 2 g via INTRAVENOUS

## 2013-03-02 MED ORDER — GLYCOPYRROLATE 0.2 MG/ML IJ SOLN
INTRAMUSCULAR | Status: AC
  Filled 2013-03-02: qty 2

## 2013-03-02 MED ORDER — LIDOCAINE HCL (PF) 1 % IJ SOLN
INTRAMUSCULAR | Status: AC
  Filled 2013-03-02: qty 5

## 2013-03-02 MED ORDER — MIDAZOLAM HCL 2 MG/2ML IJ SOLN
INTRAMUSCULAR | Status: AC
  Filled 2013-03-02: qty 2

## 2013-03-02 SURGICAL SUPPLY — 42 items
APPLIER CLIP ROT 10 11.4 M/L (STAPLE) ×3
BAG HAMPER (MISCELLANEOUS) ×3 IMPLANT
BLADE SURG SZ11 CARB STEEL (BLADE) ×3 IMPLANT
CLIP APPLIE ROT 10 11.4 M/L (STAPLE) ×2 IMPLANT
CLOTH BEACON ORANGE TIMEOUT ST (SAFETY) ×3 IMPLANT
COVER LIGHT HANDLE STERIS (MISCELLANEOUS) ×6 IMPLANT
ELECT REM PT RETURN 9FT ADLT (ELECTROSURGICAL) ×3
ELECTRODE REM PT RTRN 9FT ADLT (ELECTROSURGICAL) ×2 IMPLANT
FILTER SMOKE EVAC LAPAROSHD (FILTER) ×3 IMPLANT
FORMALIN 10 PREFIL 480ML (MISCELLANEOUS) ×3 IMPLANT
GLOVE BIOGEL PI IND STRL 7.0 (GLOVE) ×6 IMPLANT
GLOVE BIOGEL PI IND STRL 8 (GLOVE) ×2 IMPLANT
GLOVE BIOGEL PI INDICATOR 7.0 (GLOVE) ×3
GLOVE BIOGEL PI INDICATOR 8 (GLOVE) ×1
GLOVE ECLIPSE 6.5 STRL STRAW (GLOVE) ×6 IMPLANT
GLOVE ECLIPSE 8.0 STRL XLNG CF (GLOVE) ×3 IMPLANT
GOWN STRL REIN XL XLG (GOWN DISPOSABLE) ×9 IMPLANT
INST SET LAPROSCOPIC GYN AP (KITS) ×3 IMPLANT
IV NS IRRIG 3000ML ARTHROMATIC (IV SOLUTION) IMPLANT
KIT ROOM TURNOVER APOR (KITS) ×3 IMPLANT
KIT TROCAR LAP GYN (TROCAR) ×3 IMPLANT
MANIFOLD NEPTUNE II (INSTRUMENTS) ×3 IMPLANT
NEEDLE HYPO 18GX1.5 BLUNT FILL (NEEDLE) ×3 IMPLANT
NEEDLE HYPO 25X1 1.5 SAFETY (NEEDLE) ×3 IMPLANT
NS IRRIG 1000ML POUR BTL (IV SOLUTION) ×3 IMPLANT
PACK PERI GYN (CUSTOM PROCEDURE TRAY) ×3 IMPLANT
PAD ARMBOARD 7.5X6 YLW CONV (MISCELLANEOUS) ×3 IMPLANT
POUCH SPECIMEN RETRIEVAL 10MM (ENDOMECHANICALS) ×3 IMPLANT
SCALPEL HARMONIC ACE (MISCELLANEOUS) ×3 IMPLANT
SET BASIN LINEN APH (SET/KITS/TRAYS/PACK) ×3 IMPLANT
SET TUBE IRRIG SUCTION NO TIP (IRRIGATION / IRRIGATOR) IMPLANT
SOLUTION ANTI FOG 6CC (MISCELLANEOUS) ×3 IMPLANT
SPONGE GAUZE 2X2 8PLY STRL LF (GAUZE/BANDAGES/DRESSINGS) ×9 IMPLANT
STAPLER VISISTAT 35W (STAPLE) ×3 IMPLANT
SUT VICRYL 0 UR6 27IN ABS (SUTURE) ×3 IMPLANT
SYR 20CC LL (SYRINGE) ×3 IMPLANT
SYRINGE 10CC LL (SYRINGE) ×3 IMPLANT
TAPE CLOTH SURG 4X10 WHT LF (GAUZE/BANDAGES/DRESSINGS) ×3 IMPLANT
TRAY FOLEY CATH 14FR (SET/KITS/TRAYS/PACK) ×3 IMPLANT
TROCAR Z-THREAD SLEEVE 11X100 (TROCAR) ×3 IMPLANT
TUBING INSUFFLATION HIGH FLOW (TUBING) ×3 IMPLANT
WARMER LAPAROSCOPE (MISCELLANEOUS) ×3 IMPLANT

## 2013-03-02 NOTE — Transfer of Care (Signed)
Immediate Anesthesia Transfer of Care Note  Patient: Haley Nguyen  Procedure(s) Performed: Procedure(s): LAPAROSCOPIC SALPINGO OOPHORECTOMY (Bilateral) LAPAROSCOPIC LYSIS OF ADHESIONS (N/A)  Patient Location: PACU  Anesthesia Type:General  Level of Consciousness: awake and patient cooperative  Airway & Oxygen Therapy: Patient Spontanous Breathing and Patient connected to face mask oxygen  Post-op Assessment: Report given to PACU RN, Post -op Vital signs reviewed and stable and Patient moving all extremities  Post vital signs: Reviewed and stable  Complications: No apparent anesthesia complications

## 2013-03-02 NOTE — Anesthesia Procedure Notes (Signed)
Procedure Name: Intubation Date/Time: 03/02/2013 7:35 AM Performed by: Despina Hidden Pre-anesthesia Checklist: Emergency Drugs available, Patient identified, Suction available and Patient being monitored Patient Re-evaluated:Patient Re-evaluated prior to inductionOxygen Delivery Method: Circle system utilized Preoxygenation: Pre-oxygenation with 100% oxygen Intubation Type: IV induction, Cricoid Pressure applied and Rapid sequence Ventilation: Mask ventilation without difficulty Laryngoscope Size: Mac and 3 Grade View: Grade I Tube type: Oral Number of attempts: 1 Airway Equipment and Method: Stylet Placement Confirmation: ETT inserted through vocal cords under direct vision,  positive ETCO2 and breath sounds checked- equal and bilateral Secured at: 20 cm Tube secured with: Tape Dental Injury: Teeth and Oropharynx as per pre-operative assessment

## 2013-03-02 NOTE — Op Note (Signed)
Preoperative diagnosis:  1.  Complex right ovarian mass                                         2.  normal preoperative CA 125                                         3.  Right lower quadrant pain  Postoperative diagnosis:  Same as above + extensive pelvic abdomino adhesive disease  Procedure:  Laparoscopic bilateral salpingo-oophorectomy                    Extensive lysis of adhesions  Surgeon:  Rockne Coons MD  Anesthesia:  Gen. Endotracheal  Findings:  The patient was seen approximately one month ago for a several week history of right lower quadrant pain in the right lower back pain.  The patient stated it felt as if something was falling in her lower abdomen.  A pelvic sonogram was performed in the office and revealed a 4-1/2 cm complex right ovarian mass.  Her preoperative CA 125 was normal.  The left ovary could barely be seen on scan.    Intraoperatively,  the right ovary appeared to be an encased in adhesions and I thing part of the sonogram findings was a surrounding peritoneal inclusion cyst.  Description of operation:  The patient was taken to the operating room and placed in the supine position where she underwent general endotracheal anesthesia.  She was placed in the low lithotomy position.  She was prepped and draped in the usual sterile fashion and a Foley catheter was placed.  An incision was made in the umbilicus and a Veres needle was placed into the peritoneal cavity with one pass without difficulty.  The peritoneal cavity was then insufflated.  An 11 mm non-bladed direct visualization trocar was then used and placed into the peritoneal cavity with direct laparoscopic visualization again without difficulty.  Incisions were then made in the left lower quadrant and right lower quadrant.  A 10 mm trocar was placed in the right lower quadrant and a 5 mm trocar was placed in the left lower quadrant.  Both were done under direct visualization without difficulty.   Extensive  amount of taking down of omental adhsions was performed from the anterior abdominal wall and the pelvic sidewall bilaterally   The right ovary was grasped.  The harmonic scalpel was used and the infundibulopelvic ligament on the right was coagulated and then transected.  He remaining peritoneal attachments of the right ovary were also taken down hemostatically.  The left ovary was identified and again the infundibulopelvic ligament on the left was coagulated and transected.  The left ovary was removed hemostatically as well.  Both ovaries were removed via the right lower quadrant incision.  All pedicles were hemostatic.   The skin was closed using skin staples.  The umbilical fascia was closed with a single 0 Vicryl suture.  The subcutaneous tissue was reapproximated loosely using 0 Vicryl.  The skin was closed using skin staples.  The patient was awakened from anesthesia and taken to the recovery room in good stable condition with all counts being correct x3.  The estimated blood loss for the procedure was minimal  There was no bleeding from the intraperitoneal surgery  at all.  The patient received Ancef 2 grams preoperatively.  She also received Toradol IV preoperatively.  Exparel 20 cc was injected in the SQ of the incisions at the end of the procedure.  She will be discharged from the recovery room time and seen next week for staple and suture removal.  EURE,LUTHER H 8:32 AM 03/02/2013

## 2013-03-02 NOTE — Anesthesia Preprocedure Evaluation (Signed)
Anesthesia Evaluation  Patient identified by MRN, date of birth, ID band Patient awake    Reviewed: Allergy & Precautions, H&P , NPO status , Patient's Chart, lab work & pertinent test results  Airway Mallampati: I  Neck ROM: Full    Dental  (+) Edentulous Upper and Edentulous Lower   Pulmonary neg pulmonary ROS,  breath sounds clear to auscultation        Cardiovascular hypertension, Pt. on medications Rhythm:Regular Rate:Normal     Neuro/Psych    GI/Hepatic GERD-  ,  Endo/Other    Renal/GU      Musculoskeletal  (+) Arthritis -, Osteoarthritis,    Abdominal   Peds  Hematology   Anesthesia Other Findings   Reproductive/Obstetrics                           Anesthesia Physical Anesthesia Plan  ASA: II  Anesthesia Plan: General   Post-op Pain Management:    Induction: Intravenous, Rapid sequence and Cricoid pressure planned  Airway Management Planned: Oral ETT  Additional Equipment:   Intra-op Plan:   Post-operative Plan: Extubation in OR  Informed Consent: I have reviewed the patients History and Physical, chart, labs and discussed the procedure including the risks, benefits and alternatives for the proposed anesthesia with the patient or authorized representative who has indicated his/her understanding and acceptance.     Plan Discussed with:   Anesthesia Plan Comments:         Anesthesia Quick Evaluation

## 2013-03-02 NOTE — H&P (Signed)
Preoperative History and Physical  Haley Nguyen is a 79 y.o. No obstetric history on file. with No LMP recorded. Patient has had a hysterectomy. admitted for a laparoscopic removal of both ovaries due to a right ovarian cyst which is minimally complex and normal Ca 125.  She has been having some pain with the mass.   PMH:    Past Medical History  Diagnosis Date  . Arthritis   . Hypertension     PSH:     Past Surgical History  Procedure Laterality Date  . Abdominal hysterectomy      POb/GynH:      OB History   Grav Para Term Preterm Abortions TAB SAB Ect Mult Living                  SH:   History  Substance Use Topics  . Smoking status: Former Smoker -- 30 years    Types: Cigarettes  . Smokeless tobacco: Not on file  . Alcohol Use: 0.0 oz/week     Comment: occasional drink    FH:   History reviewed. No pertinent family history.   Allergies: No Known Allergies  Medications:      Current facility-administered medications:ceFAZolin (ANCEF) IVPB 2 g/50 mL premix, 2 g, Intravenous, On Call to OR, Lazaro Arms, MD;  ketorolac (TORADOL) 30 MG/ML injection 30 mg, 30 mg, Intravenous, Once, Lazaro Arms, MD  Review of Systems:   Review of Systems  Constitutional: Negative for fever, chills, weight loss, malaise/fatigue and diaphoresis.  HENT: Negative for hearing loss, ear pain, nosebleeds, congestion, sore throat, neck pain, tinnitus and ear discharge.   Eyes: Negative for blurred vision, double vision, photophobia, pain, discharge and redness.  Respiratory: Negative for cough, hemoptysis, sputum production, shortness of breath, wheezing and stridor.   Cardiovascular: Negative for chest pain, palpitations, orthopnea, claudication, leg swelling and PND.  Gastrointestinal: Negative for abdominal pain. Negative for heartburn, nausea, vomiting, diarrhea, constipation, blood in stool and melena.  Genitourinary: Negative for dysuria, urgency, frequency, hematuria and  flank pain.  Musculoskeletal: Negative for myalgias, back pain, joint pain and falls.  Skin: Negative for itching and rash.  Neurological: Negative for dizziness, tingling, tremors, sensory change, speech change, focal weakness, seizures, loss of consciousness, weakness and headaches.  Endo/Heme/Allergies: Negative for environmental allergies and polydipsia. Does not bruise/bleed easily.  Psychiatric/Behavioral: Negative for depression, suicidal ideas, hallucinations, memory loss and substance abuse. The patient is not nervous/anxious and does not have insomnia.      PHYSICAL EXAM:  Blood pressure 144/66, pulse 57, temperature 97.6 F (36.4 C), temperature source Oral, resp. rate 19, height 5\' 6"  (1.676 m), weight 174 lb (78.926 kg), SpO2 97.00%.    Vitals reviewed. Constitutional: She is oriented to person, place, and time. She appears well-developed and well-nourished.  HENT:  Head: Normocephalic and atraumatic.  Right Ear: External ear normal.  Left Ear: External ear normal.  Nose: Nose normal.  Mouth/Throat: Oropharynx is clear and moist.  Eyes: Conjunctivae and EOM are normal. Pupils are equal, round, and reactive to light. Right eye exhibits no discharge. Left eye exhibits no discharge. No scleral icterus.  Neck: Normal range of motion. Neck supple. No tracheal deviation present. No thyromegaly present.  Cardiovascular: Normal rate, regular rhythm, normal heart sounds and intact distal pulses.  Exam reveals no gallop and no friction rub.   No murmur heard. Respiratory: Effort normal and breath sounds normal. No respiratory distress. She has no wheezes. She has no rales. She exhibits  no tenderness.  GI: Soft. Bowel sounds are normal. She exhibits no distension and no mass. There is tenderness. There is no rebound and no guarding.  Genitourinary:       Vulva is normal without lesions Vagina is pink moist without discharge Cervix absent Uterus is absent Adnexa is per sonogram  reports Musculoskeletal: Normal range of motion. She exhibits no edema and no tenderness.  Neurological: She is alert and oriented to person, place, and time. She has normal reflexes. She displays normal reflexes. No cranial nerve deficit. She exhibits normal muscle tone. Coordination normal.  Skin: Skin is warm and dry. No rash noted. No erythema. No pallor.  Psychiatric: She has a normal mood and affect. Her behavior is normal. Judgment and thought content normal.    Labs: Results for orders placed during the hospital encounter of 03/02/13 (from the past 336 hour(s))  POCT I-STAT 4, (NA,K, GLUC, HGB,HCT)   Collection Time    03/02/13  6:45 AM      Result Value Range   Sodium 141  135 - 145 mEq/L   Potassium 4.6  3.5 - 5.1 mEq/L   Glucose, Bld 107 (*) 70 - 99 mg/dL   HCT 11.9 (*) 14.7 - 82.9 %   Hemoglobin 16.0 (*) 12.0 - 15.0 g/dL  Results for orders placed during the hospital encounter of 02/23/13 (from the past 336 hour(s))  URINALYSIS, ROUTINE W REFLEX MICROSCOPIC   Collection Time    02/23/13 10:42 AM      Result Value Range   Color, Urine YELLOW  YELLOW   APPearance CLEAR  CLEAR   Specific Gravity, Urine 1.010  1.005 - 1.030   pH 6.5  5.0 - 8.0   Glucose, UA NEGATIVE  NEGATIVE mg/dL   Hgb urine dipstick NEGATIVE  NEGATIVE   Bilirubin Urine NEGATIVE  NEGATIVE   Ketones, ur NEGATIVE  NEGATIVE mg/dL   Protein, ur NEGATIVE  NEGATIVE mg/dL   Urobilinogen, UA 0.2  0.0 - 1.0 mg/dL   Nitrite NEGATIVE  NEGATIVE   Leukocytes, UA NEGATIVE  NEGATIVE  CBC   Collection Time    02/23/13 11:00 AM      Result Value Range   WBC 5.9  4.0 - 10.5 K/uL   RBC 4.97  3.87 - 5.11 MIL/uL   Hemoglobin 14.5  12.0 - 15.0 g/dL   HCT 56.2  13.0 - 86.5 %   MCV 85.1  78.0 - 100.0 fL   MCH 29.2  26.0 - 34.0 pg   MCHC 34.3  30.0 - 36.0 g/dL   RDW 78.4  69.6 - 29.5 %   Platelets 270  150 - 400 K/uL  COMPREHENSIVE METABOLIC PANEL   Collection Time    02/23/13 11:00 AM      Result Value Range    Sodium 138  135 - 145 mEq/L   Potassium 5.6 (*) 3.5 - 5.1 mEq/L   Chloride 101  96 - 112 mEq/L   CO2 30  19 - 32 mEq/L   Glucose, Bld 83  70 - 99 mg/dL   BUN 18  6 - 23 mg/dL   Creatinine, Ser 2.84  0.50 - 1.10 mg/dL   Calcium 13.2  8.4 - 44.0 mg/dL   Total Protein 7.6  6.0 - 8.3 g/dL   Albumin 4.1  3.5 - 5.2 g/dL   AST 20  0 - 37 U/L   ALT 15  0 - 35 U/L   Alkaline Phosphatase 104  39 - 117 U/L  Total Bilirubin 0.3  0.3 - 1.2 mg/dL   GFR calc non Af Amer 47 (*) >90 mL/min   GFR calc Af Amer 55 (*) >90 mL/min  SURGICAL PCR SCREEN   Collection Time    02/23/13 11:10 AM      Result Value Range   MRSA, PCR NEGATIVE  NEGATIVE   Staphylococcus aureus NEGATIVE  NEGATIVE    EKG: Orders placed during the hospital encounter of 02/23/13  . EKG 12-LEAD  . EKG 12-LEAD  . EKG    Imaging Studies:     Assessment: Patient Active Problem List  Diagnosis  . Ovarian cystic mass  . Abdominal pain, right lower quadrant    Plan: Laparoscopic bilateral salpingo oophorectomy.  She understands the risks of the procedure.  EURE,LUTHER H 03/02/2013 7:09 AM

## 2013-03-02 NOTE — Anesthesia Postprocedure Evaluation (Signed)
  Anesthesia Post-op Note  Patient: Haley Nguyen  Procedure(s) Performed: Procedure(s): LAPAROSCOPIC SALPINGO OOPHORECTOMY (Bilateral) LAPAROSCOPIC LYSIS OF ADHESIONS (N/A)  Patient Location: PACU  Anesthesia Type:General  Level of Consciousness: awake, alert , oriented and patient cooperative  Airway and Oxygen Therapy: Patient Spontanous Breathing  Post-op Pain: 2 /10, mild  Post-op Assessment: Post-op Vital signs reviewed, Patient's Cardiovascular Status Stable, Respiratory Function Stable, Patent Airway, No signs of Nausea or vomiting and Pain level controlled  Post-op Vital Signs: Reviewed and stable  Complications: No apparent anesthesia complications 

## 2013-03-03 ENCOUNTER — Encounter (HOSPITAL_COMMUNITY): Payer: Self-pay | Admitting: Obstetrics & Gynecology

## 2013-03-03 NOTE — Addendum Note (Signed)
Addendum created 03/03/13 1352 by Despina Hidden, CRNA   Modules edited: Notes Section   Notes Section:  File: [id number]

## 2013-03-03 NOTE — Anesthesia Postprocedure Evaluation (Signed)
  Anesthesia Post-op Note  Patient: Haley Nguyen  Procedure(s) Performed: Procedure(s): LAPAROSCOPIC SALPINGO OOPHORECTOMY (Bilateral) LAPAROSCOPIC LYSIS OF ADHESIONS (N/A)  Patient Location: PACU  Anesthesia Type:General  Level of Consciousness: awake, alert , oriented and patient cooperative  Airway and Oxygen Therapy: Patient Spontanous Breathing  Post-op Pain: 2 /10, mild  Post-op Assessment: Post-op Vital signs reviewed, Patient's Cardiovascular Status Stable, Respiratory Function Stable, Patent Airway, No signs of Nausea or vomiting and Pain level controlled  Post-op Vital Signs: Reviewed and stable  Complications: No apparent anesthesia complications

## 2013-03-09 ENCOUNTER — Ambulatory Visit (INDEPENDENT_AMBULATORY_CARE_PROVIDER_SITE_OTHER): Payer: 59 | Admitting: Obstetrics & Gynecology

## 2013-03-09 ENCOUNTER — Encounter: Payer: Self-pay | Admitting: Obstetrics & Gynecology

## 2013-03-09 ENCOUNTER — Ambulatory Visit: Payer: 59 | Admitting: Obstetrics & Gynecology

## 2013-03-09 VITALS — BP 140/64 | Wt 175.5 lb

## 2013-03-09 DIAGNOSIS — Z9889 Other specified postprocedural states: Secondary | ICD-10-CM

## 2013-03-09 NOTE — Progress Notes (Signed)
Patient ID: Haley Nguyen, female   DOB: 1932-12-05, 79 y.o.   MRN: [id number] Post op laparoscopic removal of both tubes and ovaries No complaints   Incision clean dry intact  Pathology benign  Follow up prn or 1 year

## 2013-05-29 ENCOUNTER — Encounter: Payer: Self-pay | Admitting: Adult Health

## 2013-05-29 ENCOUNTER — Ambulatory Visit (INDEPENDENT_AMBULATORY_CARE_PROVIDER_SITE_OTHER): Payer: 59 | Admitting: Adult Health

## 2013-05-29 VITALS — BP 112/70 | Ht 67.0 in | Wt 174.5 lb

## 2013-05-29 DIAGNOSIS — Z1389 Encounter for screening for other disorder: Secondary | ICD-10-CM

## 2013-05-29 DIAGNOSIS — R1031 Right lower quadrant pain: Secondary | ICD-10-CM

## 2013-05-29 HISTORY — DX: Right lower quadrant pain: R10.31

## 2013-05-29 LAB — POCT URINALYSIS DIPSTICK
Blood, UA: NEGATIVE
Glucose, UA: NEGATIVE
Leukocytes, UA: NEGATIVE
Nitrite, UA: NEGATIVE

## 2013-05-29 LAB — CBC
MCH: 28.5 pg (ref 26.0–34.0)
Platelets: 310 10*3/uL (ref 150–400)
RBC: 4.78 MIL/uL (ref 3.87–5.11)

## 2013-05-29 NOTE — Patient Instructions (Addendum)
Diverticulosis Diverticulosis is a common condition that develops when small pouches (diverticula) form in the wall of the colon. The risk of diverticulosis increases with age. It happens more often in people who eat a low-fiber diet. Most individuals with diverticulosis have no symptoms. Those individuals with symptoms usually experience abdominal pain, constipation, or loose stools (diarrhea). HOME CARE INSTRUCTIONS   Increase the amount of fiber in your diet as directed by your caregiver or dietician. This may reduce symptoms of diverticulosis.  Your caregiver may recommend taking a dietary fiber supplement.  Drink at least 6 to 8 glasses of water each day to prevent constipation.  Try not to strain when you have a bowel movement.  Your caregiver may recommend avoiding nuts and seeds to prevent complications, although this is still an uncertain benefit.  Only take over-the-counter or prescription medicines for pain, discomfort, or fever as directed by your caregiver. FOODS WITH HIGH FIBER CONTENT INCLUDE:  Fruits. Apple, peach, pear, tangerine, raisins, prunes.  Vegetables. Brussels sprouts, asparagus, broccoli, cabbage, carrot, cauliflower, romaine lettuce, spinach, summer squash, tomato, winter squash, zucchini.  Starchy Vegetables. Baked beans, kidney beans, lima beans, split peas, lentils, potatoes (with skin).  Grains. Whole wheat bread, brown rice, bran flake cereal, plain oatmeal, white rice, shredded wheat, bran muffins. SEEK IMMEDIATE MEDICAL CARE IF:   You develop increasing pain or severe bloating.  You have an oral temperature above 102 F (38.9 C), not controlled by medicine.  You develop vomiting or bowel movements that are bloody or black. Document Released: 07/23/2004 Document Revised: 01/18/2012 Document Reviewed: 03/26/2010 [facility] The Patient Information 2014 [place], [place]. Avoid seeds and spicy foods  Follow up in about 1 week

## 2013-05-29 NOTE — Progress Notes (Signed)
Subjective:     Patient ID: Haley Nguyen, female   DOB: 16-Jun-1933, 79 y.o.   MRN: [id number]  HPI Kae is a 79 year old white female in complaining of RLQ on and off for a while now, but only told family in the last week.She has constipation and takes fiber.She had a BSO and LOA on 03/02/13 by Dr Despina Hidden.She describes the pain like she is having her period.No rectal bleeding. Granddaughter with her.  Review of Systems Positives in HPI   Reviewed past medical,surgical, social and family history. Reviewed medications and allergies.  Objective:   Physical Exam BP 112/70  Ht 5\' 7"  (1.702 m)  Wt 174 lb 8 oz (79.153 kg)  BMI 27.32 kg/m2   Urine dipstick negative.Abdomen is soft, no HSM, she does have discomfort in RLQ.Skin warm and dry.Pelvic: external genitalia is normal in appearance for age, vagina: atrophic, the cervix and uterus are absent, adnexa: no masses noted, there is tenderness RLQ, on rectal exam no polyps or hemorrhoids felt and hemoccult was negative. Assessment:     RLQ pain ? diverticulosis    Plan:     Avoid seeds and nuts and corn and spicy foods Review handout on diverticulosis Check CBC,CMP,TSH Follow up in about1 week

## 2013-05-30 ENCOUNTER — Telehealth: Payer: Self-pay | Admitting: Adult Health

## 2013-05-30 LAB — COMPREHENSIVE METABOLIC PANEL
ALT: 17 U/L (ref 0–35)
Albumin: 4.4 g/dL (ref 3.5–5.2)
Alkaline Phosphatase: 105 U/L (ref 39–117)
CO2: 30 mEq/L (ref 19–32)
Potassium: 5.6 mEq/L — ABNORMAL HIGH (ref 3.5–5.3)
Sodium: 138 mEq/L (ref 135–145)
Total Bilirubin: 0.5 mg/dL (ref 0.3–1.2)
Total Protein: 7.8 g/dL (ref 6.0–8.3)

## 2013-05-30 LAB — SEDIMENTATION RATE: Sed Rate: 22 mm/hr (ref 0–22)

## 2013-05-30 NOTE — Telephone Encounter (Signed)
Haley Nguyen, please review labs. Thanks!!! [employer]

## 2013-05-30 NOTE — Telephone Encounter (Signed)
Left message to call back  

## 2013-05-31 NOTE — Telephone Encounter (Signed)
Haley Nguyen is aware of labs, will follow up next week

## 2013-06-08 ENCOUNTER — Ambulatory Visit: Payer: 59 | Admitting: Adult Health

## 2013-06-15 ENCOUNTER — Encounter: Payer: Self-pay | Admitting: Adult Health

## 2013-06-15 ENCOUNTER — Ambulatory Visit (INDEPENDENT_AMBULATORY_CARE_PROVIDER_SITE_OTHER): Payer: 59 | Admitting: Adult Health

## 2013-06-15 VITALS — BP 130/64 | Ht 67.0 in | Wt 172.0 lb

## 2013-06-15 DIAGNOSIS — R1031 Right lower quadrant pain: Secondary | ICD-10-CM

## 2013-06-15 DIAGNOSIS — E875 Hyperkalemia: Secondary | ICD-10-CM

## 2013-06-15 NOTE — Progress Notes (Signed)
Subjective:     Patient ID: Haley Nguyen, female   DOB: 11/24/1932, 79 y.o.   MRN: [id number]  HPI Haley Nguyen is back in follow up of changing diet to see if RLQ pain better and it was,but still hurts occasionally.Her potasium was elevated and will re check it today.  Review of Systems See HPI Reviewed past medical,surgical, social and family history. Reviewed medications and allergies.     Objective:   Physical Exam BP 130/64  Ht 5\' 7"  (1.702 m)  Wt 172 lb (78.019 kg)  BMI 26.93 kg/m2 Skin warm and dry.Pelvic: external genitalia is normal in appearance age, vagina: atrophic, cervix and uterus are absent, adnexa: no masses  Noted, she is still tender RLQ with palpation.    Assessment:     RLQ pain which I think is bowel related elevated potasium    Plan:    Follow up prn Check CMP Continue to eat no seeds and follow up with GI in [place]

## 2013-06-15 NOTE — Patient Instructions (Addendum)
Follow prn  See GI in follow up of RLQ pain

## 2013-06-16 LAB — COMPREHENSIVE METABOLIC PANEL
CO2: 28 mEq/L (ref 19–32)
Creat: 1.14 mg/dL — ABNORMAL HIGH (ref 0.50–1.10)
Glucose, Bld: 85 mg/dL (ref 70–99)
Total Bilirubin: 0.3 mg/dL (ref 0.3–1.2)

## 2013-06-19 ENCOUNTER — Telehealth: Payer: Self-pay | Admitting: Adult Health

## 2013-06-19 NOTE — Telephone Encounter (Signed)
Left message that K+ normal

## 2014-09-10 ENCOUNTER — Encounter: Payer: Self-pay | Admitting: Adult Health

## 2015-10-19 ENCOUNTER — Emergency Department (HOSPITAL_COMMUNITY): Payer: Medicare PPO

## 2015-10-19 ENCOUNTER — Encounter (HOSPITAL_COMMUNITY): Payer: Self-pay | Admitting: Emergency Medicine

## 2015-10-19 ENCOUNTER — Emergency Department (HOSPITAL_COMMUNITY)
Admission: EM | Admit: 2015-10-19 | Discharge: 2015-10-19 | Disposition: A | Discharge status: Home / Self Care | Payer: Medicare PPO | Attending: Emergency Medicine | Admitting: Emergency Medicine

## 2015-10-19 DIAGNOSIS — Y998 Other external cause status: Secondary | ICD-10-CM | POA: Diagnosis not present

## 2015-10-19 DIAGNOSIS — W01198A Fall on same level from slipping, tripping and stumbling with subsequent striking against other object, initial encounter: Secondary | ICD-10-CM | POA: Diagnosis not present

## 2015-10-19 DIAGNOSIS — T23272A Burn of second degree of left wrist, initial encounter: Secondary | ICD-10-CM | POA: Insufficient documentation

## 2015-10-19 DIAGNOSIS — T23252A Burn of second degree of left palm, initial encounter: Secondary | ICD-10-CM | POA: Insufficient documentation

## 2015-10-19 DIAGNOSIS — I10 Essential (primary) hypertension: Secondary | ICD-10-CM | POA: Diagnosis not present

## 2015-10-19 DIAGNOSIS — X150XXA Contact with hot stove (kitchen), initial encounter: Secondary | ICD-10-CM | POA: Diagnosis not present

## 2015-10-19 DIAGNOSIS — Y9389 Activity, other specified: Secondary | ICD-10-CM | POA: Insufficient documentation

## 2015-10-19 DIAGNOSIS — Z79899 Other long term (current) drug therapy: Secondary | ICD-10-CM | POA: Insufficient documentation

## 2015-10-19 DIAGNOSIS — Z23 Encounter for immunization: Secondary | ICD-10-CM | POA: Diagnosis not present

## 2015-10-19 DIAGNOSIS — Y9289 Other specified places as the place of occurrence of the external cause: Secondary | ICD-10-CM | POA: Diagnosis not present

## 2015-10-19 DIAGNOSIS — Z87891 Personal history of nicotine dependence: Secondary | ICD-10-CM | POA: Diagnosis not present

## 2015-10-19 DIAGNOSIS — M1990 Unspecified osteoarthritis, unspecified site: Secondary | ICD-10-CM | POA: Diagnosis not present

## 2015-10-19 DIAGNOSIS — T23202A Burn of second degree of left hand, unspecified site, initial encounter: Secondary | ICD-10-CM

## 2015-10-19 MED ORDER — SILVER SULFADIAZINE 1 % EX CREA: 1.0000 "application " | TOPICAL_CREAM | Freq: Every day | CUTANEOUS | Status: DC

## 2015-10-19 MED ORDER — TETANUS-DIPHTH-ACELL PERTUSSIS 5-2.5-18.5 LF-MCG/0.5 IM SUSP
0.5000 mL | Freq: Once | INTRAMUSCULAR | Status: AC
  Administered 2015-10-19: 0.5 mL via INTRAMUSCULAR
  Filled 2015-10-19: qty 0.5

## 2015-10-19 MED ORDER — SILVER SULFADIAZINE 1 % EX CREA
TOPICAL_CREAM | Freq: Once | CUTANEOUS | Status: AC
  Administered 2015-10-19: 1 via TOPICAL
  Filled 2015-10-19: qty 50
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
## 2015-10-19 NOTE — Discharge Instructions (Signed)
Burn Care °Burns hurt your skin. When your skin is hurt, it is easier to get an infection. Follow your doctor's directions to help prevent an infection. °HOME CARE °· Wash your hands well before you change your bandage. °· Change your bandage as often as told by your doctor. °¨ Remove the old bandage. If the bandage sticks, soak it off with cool, clean water. °¨ Gently clean the burn with mild soap and water. °¨ Pat the burn dry with a clean, dry cloth. °¨ Put a thin layer of medicated cream on the burn. °¨ Put a clean bandage on as told by your doctor. °¨ Keep the bandage clean and dry. °· Raise (elevate) the burn for the first 24 hours. After that, follow your doctor's directions. °· Only take medicine as told by your doctor. °GET HELP RIGHT AWAY IF:  °· You have too much pain. °· The skin near the burn is red, tender, puffy (swollen), or has red streaks. °· The burn area has yellowish white fluid (pus) or a bad smell coming from it. °· You have a fever. °MAKE SURE YOU:  °· Understand these instructions. °· Will watch your condition. °· Will get help right away if you are not doing well or get worse. °  °This information is not intended to replace advice given to you by your health care provider. Make sure you discuss any questions you have with your health care provider. °  °Document Released: 08/04/2008 Document Revised: 01/18/2012 Document Reviewed: 03/18/2011 °Elsevier Interactive Patient Education ©2016 [employer] ° °
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
## 2015-10-22 ENCOUNTER — Ambulatory Visit (HOSPITAL_COMMUNITY): Payer: Medicare PPO | Attending: Internal Medicine | Admitting: Physical Therapy

## 2015-10-22 DIAGNOSIS — X58XXXA Exposure to other specified factors, initial encounter: Secondary | ICD-10-CM | POA: Insufficient documentation

## 2015-10-22 DIAGNOSIS — T300 Burn of unspecified body region, unspecified degree: Secondary | ICD-10-CM
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
## 2015-12-24 ENCOUNTER — Other Ambulatory Visit (HOSPITAL_COMMUNITY): Payer: Self-pay | Admitting: Family Medicine

## 2015-12-24 ENCOUNTER — Ambulatory Visit (HOSPITAL_COMMUNITY)
Admission: RE | Admit: 2015-12-24 | Discharge: 2015-12-24 | Disposition: A | Discharge status: Home / Self Care | Payer: Medicare PPO | Source: Ambulatory Visit | Attending: Family Medicine | Admitting: Family Medicine

## 2015-12-24 DIAGNOSIS — K5790 Diverticulosis of intestine, part unspecified, without perforation or abscess without bleeding: Secondary | ICD-10-CM | POA: Diagnosis not present

## 2015-12-24 DIAGNOSIS — R1084 Generalized abdominal pain: Secondary | ICD-10-CM

## 2015-12-24 DIAGNOSIS — K760 Fatty (change of) liver, not elsewhere classified: Secondary | ICD-10-CM | POA: Diagnosis not present

## 2015-12-24 LAB — POCT I-STAT CREATININE: CREATININE: 1.1 mg/dL — AB (ref 0.44–1.00)

## 2015-12-24 MED ORDER — IOHEXOL 300 MG/ML  SOLN
100.0000 mL | Freq: Once | INTRAMUSCULAR | Status: AC | PRN
  Administered 2015-12-24: 100 mL via INTRAVENOUS

## 2015-12-24 MED ORDER — DIATRIZOATE MEGLUMINE & SODIUM 66-10 % PO SOLN
ORAL | Status: AC
  Filled 2015-12-24: qty 30

## 2015-12-25 ENCOUNTER — Encounter (HOSPITAL_COMMUNITY): Payer: Self-pay | Admitting: Emergency Medicine

## 2015-12-25 ENCOUNTER — Emergency Department (HOSPITAL_COMMUNITY)
Admission: EM | Admit: 2015-12-25 | Discharge: 2015-12-26 | Disposition: A | Discharge status: Home / Self Care | Payer: Medicare PPO | Attending: Emergency Medicine | Admitting: Emergency Medicine

## 2015-12-25 DIAGNOSIS — Z90710 Acquired absence of both cervix and uterus: Secondary | ICD-10-CM | POA: Insufficient documentation

## 2015-12-25 DIAGNOSIS — Z79899 Other long term (current) drug therapy: Secondary | ICD-10-CM | POA: Insufficient documentation

## 2015-12-25 DIAGNOSIS — I10 Essential (primary) hypertension: Secondary | ICD-10-CM | POA: Insufficient documentation

## 2015-12-25 DIAGNOSIS — M546 Pain in thoracic spine: Secondary | ICD-10-CM | POA: Insufficient documentation

## 2015-12-25 DIAGNOSIS — R1110 Vomiting, unspecified: Secondary | ICD-10-CM | POA: Insufficient documentation

## 2015-12-25 DIAGNOSIS — R1010 Upper abdominal pain, unspecified: Secondary | ICD-10-CM | POA: Diagnosis present

## 2015-12-25 DIAGNOSIS — Z87891 Personal history of nicotine dependence: Secondary | ICD-10-CM | POA: Insufficient documentation

## 2015-12-25 LAB — COMPREHENSIVE METABOLIC PANEL
ALBUMIN: 3.4 g/dL — AB (ref 3.5–5.0)
ALK PHOS: 81 U/L (ref 38–126)
ALT: 38 U/L (ref 14–54)
ANION GAP: 9 (ref 5–15)
AST: 32 U/L (ref 15–41)
BILIRUBIN TOTAL: 0.9 mg/dL (ref 0.3–1.2)
BUN: 12 mg/dL (ref 6–20)
CALCIUM: 8.5 mg/dL — AB (ref 8.9–10.3)
CO2: 27 mmol/L (ref 22–32)
Chloride: 103 mmol/L (ref 101–111)
Creatinine, Ser: 1.07 mg/dL — ABNORMAL HIGH (ref 0.44–1.00)
GFR calc non Af Amer: 48 mL/min — ABNORMAL LOW (ref >60)
GFR, EST AFRICAN AMERICAN: 55 mL/min — AB (ref >60)
GLUCOSE: 99 mg/dL (ref 65–99)
POTASSIUM: 3.5 mmol/L (ref 3.5–5.1)
SODIUM: 139 mmol/L (ref 135–145)
TOTAL PROTEIN: 7.2 g/dL (ref 6.5–8.1)

## 2015-12-25 LAB — URINALYSIS, ROUTINE W REFLEX MICROSCOPIC
Bilirubin Urine: NEGATIVE
Glucose, UA: NEGATIVE mg/dL
Hgb urine dipstick: NEGATIVE
Ketones, ur: NEGATIVE mg/dL
Leukocytes, UA: NEGATIVE
NITRITE: NEGATIVE
PH: 7 (ref 5.0–8.0)
Protein, ur: NEGATIVE mg/dL
SPECIFIC GRAVITY, URINE: 1.01 (ref 1.005–1.030)

## 2015-12-25 LAB — CBC
HEMATOCRIT: 37.2 % (ref 36.0–46.0)
HEMOGLOBIN: 12 g/dL (ref 12.0–15.0)
MCH: 28.8 pg (ref 26.0–34.0)
MCHC: 32.3 g/dL (ref 30.0–36.0)
MCV: 89.4 fL (ref 78.0–100.0)
Platelets: 283 10*3/uL (ref 150–400)
RBC: 4.16 MIL/uL (ref 3.87–5.11)
RDW: 13.2 % (ref 11.5–15.5)
WBC: 9.7 10*3/uL (ref 4.0–10.5)

## 2015-12-25 LAB — LIPASE, BLOOD: Lipase: 22 U/L (ref 11–51)

## 2015-12-25 MED ORDER — KETOROLAC TROMETHAMINE 30 MG/ML IJ SOLN
15.0000 mg | Freq: Once | INTRAMUSCULAR | Status: AC
  Administered 2015-12-25: 15 mg via INTRAVENOUS
  Filled 2015-12-25: qty 1

## 2015-12-25 MED ORDER — DIAZEPAM 5 MG/ML IJ SOLN
5.0000 mg | Freq: Once | INTRAMUSCULAR | Status: AC
  Administered 2015-12-25: 5 mg via INTRAVENOUS
  Filled 2015-12-25: qty 2

## 2015-12-25 MED ORDER — ONDANSETRON HCL 4 MG/2ML IJ SOLN
4.0000 mg | Freq: Once | INTRAMUSCULAR | Status: AC
  Administered 2015-12-25: 4 mg via INTRAVENOUS
  Filled 2015-12-25: qty 2

## 2015-12-25 MED ORDER — DEXAMETHASONE SODIUM PHOSPHATE 10 MG/ML IJ SOLN
10.0000 mg | Freq: Once | INTRAMUSCULAR | Status: AC
  Administered 2015-12-25: 10 mg via INTRAVENOUS
  Filled 2015-12-25: qty 1

## 2015-12-25 NOTE — ED Notes (Signed)
Pt reports severe RT sided flank pain and vomiting. Pt currently being treated for a UTI. Pt seen yesterday and had a CT scan for the same complaint.

## 2015-12-25 NOTE — ED Provider Notes (Addendum)
CSN: [id number]     Arrival date & time 12/25/15  1756 History  By signing my name below, I, Rohini Rajnarayanan, attest that this documentation has been prepared under the direction and in the presence of Devoria Albe, MD at 2315 Electronically Signed: Charlean Merl, ED Scribe 12/25/2015 at 11:03 PM.    Chief Complaint  Patient presents with  . Flank Pain   The history is provided by the patient. No language interpreter was used.   HPI Comments: Haley Nguyen is a 82 y.o. female who presents to the Emergency Department complaining of severe, gradual onset, constant, persistent, ongoing, sharp, right sided flank pain with associated vomiting. Pain is exacerbated by movement and states the pain is a "sticking" pain. Pt vomited once yesterday night. Pt is currently taking Gabapentin -600 mg 3x per day - and traMaDol 4x per day, for pain. Pt was seen by her PCP yesterday for similar complaints. She had a A/P CT yesterday for these complaints to make sure she did not have intra-abdominal problem causing the pain however the CT did not show any acute findings. Pt is also currently being treated for a UTI. Pt also has c/o chronic back pain, for which she was seen by [facility]. Pain has recently begun to radiate to the upper leg. She has gotten steroid injections in her back which the family reports has helped her pain. Pt denies any dysuria, frequency, urgency or diarrhea. She had some nausea and vomiting last night however her PCP gave her Phenergan to use for nausea. She does not associate the pain with any type of food. 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 However they report the steroid injections given at the PCP office has helped with the pain radiating into her legs however she still has the upper back pain that radiates into her right lateral  abdomen. She has an appointment in 1 week to be seen by Dr. Hilda Lias, orthopedist. PCP Dr Reinaldo Berber at [facility]   Past Medical History  Diagnosis Date  . Arthritis   . Hypertension   . RLQ abdominal pain 05/29/2013   Past Surgical History  Procedure Laterality Date  . Abdominal hysterectomy    . Laparoscopic salpingo oopherectomy Bilateral 03/02/2013    Procedure: LAPAROSCOPIC SALPINGO OOPHORECTOMY;  Surgeon: Lazaro Arms, MD;  Location: [facility];  Service: Gynecology;  Laterality: Bilateral;  . Laparoscopic lysis of adhesions N/A 03/02/2013    Procedure: LAPAROSCOPIC LYSIS OF ADHESIONS;  Surgeon: Lazaro Arms, MD;  Location: [facility];  Service: Gynecology;  Laterality: N/A;   Family History  Problem Relation Age of Onset  . Heart disease Father   . Heart disease Mother    Social History  Substance Use Topics  . Smoking status: Former Smoker -- 30 years    Types: Cigarettes  . Smokeless tobacco: Former Neurosurgeon    Types: Chew, Snuff  . Alcohol Use: 0.0 oz/week     Comment: occasional drink    Pt lives alone.  Her daughter lives next door to her.  Pt does not use a walker.   OB History    Gravida Para Term Preterm AB TAB SAB Ectopic Multiple Living   1 1        1      Review of Systems  Gastrointestinal: Positive for vomiting. Negative for diarrhea.  Genitourinary:  Positive for flank pain (Rt side). Negative for dysuria and urgency.  All other systems reviewed and are negative.  Allergies  Review of patient's allergies indicates no known allergies.  Home Medications   Prior to Admission medications   Medication Sig Start Date End Date Taking? Authorizing Provider  Alpha-D-Galactosidase Charlyne Quale) TABS Take 1-2 tablets by mouth daily as needed (for flatulence).   Yes Historical Provider, MD  amLODipine (NORVASC) 10 MG tablet Take 10 mg by mouth daily.   Yes Historical Provider, MD  docusate sodium (COLACE) 100 MG capsule Take 100-200 mg by mouth daily.    Yes  Historical Provider, MD  gabapentin (NEURONTIN) 600 MG tablet Take 600 mg by mouth 3 (three) times daily.   Yes Historical Provider, MD  Garlic 1000 MG CAPS Take 1 capsule by mouth daily.   Yes Historical Provider, MD  Magnesium Oxide (PHILLIPS) 500 MG (LAX) TABS Take 1-2 tablets by mouth daily.   Yes Historical Provider, MD  metoprolol (LOPRESSOR) 50 MG tablet Take 50 mg by mouth 2 (two) times daily.   Yes Historical Provider, MD  nitrofurantoin, macrocrystal-monohydrate, (MACROBID) 100 MG capsule Take 100 mg by mouth 2 (two) times daily. 7 day course starting on 12/24/15   Yes Historical Provider, MD  Omega-3 Fatty Acids (FISH OIL) 1000 MG CAPS Take 1 capsule by mouth daily.   Yes Historical Provider, MD  polycarbophil (FIBERCON) 625 MG tablet Take 625-1,250 mg by mouth daily.    Yes Historical Provider, MD  promethazine (PHENERGAN) 25 MG tablet Take 25 mg by mouth every 6 (six) hours as needed for nausea or vomiting.  12/24/15  Yes Historical Provider, MD  simvastatin (ZOCOR) 40 MG tablet Take 20 mg by mouth every evening.   Yes Historical Provider, MD  traMADol (ULTRAM) 50 MG tablet Take 50 mg by mouth every 6 (six) hours as needed for pain.   Yes Historical Provider, MD   BP 162/73 mmHg  Pulse 83  Resp 20  SpO2 98%  Vital signs normal   Physical Exam  Constitutional: She is oriented to person, place, and time. She appears well-developed and well-nourished.  Non-toxic appearance. She does not appear ill. No distress.  HENT:  Head: Normocephalic and atraumatic.  Right Ear: External ear normal.  Left Ear: External ear normal.  Nose: Nose normal. No mucosal edema or rhinorrhea.  Mouth/Throat: Oropharynx is clear and moist and mucous membranes are normal. No dental abscesses or uvula swelling.  Eyes: Conjunctivae and EOM are normal. Pupils are equal, round, and reactive to light.  Neck: Normal range of motion and full passive range of motion without pain. Neck supple.  Cardiovascular:  Normal rate, regular rhythm and normal heart sounds.  Exam reveals no gallop and no friction rub.   No murmur heard. Pulmonary/Chest: Effort normal and breath sounds normal. No respiratory distress. She has no wheezes. She has no rhonchi. She has no rales. She exhibits no tenderness and no crepitus.  Abdominal: Soft. Normal appearance and bowel sounds are normal. She exhibits no distension. There is no tenderness. There is no rebound and no guarding.    Musculoskeletal: Normal range of motion. She exhibits no edema or tenderness.       Back:  Tender in lower thoracic, upper lumbar spine. Tender also along lower right posterior ribcage into the right abdomen.  Moves all extremities well.   Neurological: She is alert and oriented to person, place, and time. She has normal strength. No cranial nerve deficit.  Skin: Skin  is warm, dry and intact. No rash noted. No erythema. No pallor.  Psychiatric: She has a normal mood and affect. Her speech is normal and behavior is normal. Her mood appears not anxious.  Nursing note and vitals reviewed.   ED Course  Procedures  Medications  traMADol (ULTRAM) tablet 50 mg (not administered)  ondansetron (ZOFRAN) injection 4 mg (4 mg Intravenous Given 12/25/15 2342)  dexamethasone (DECADRON) injection 10 mg (10 mg Intravenous Given 12/25/15 2343)  diazepam (VALIUM) injection 5 mg (5 mg Intravenous Given 12/25/15 2343)  ketorolac (TORADOL) 30 MG/ML injection 15 mg (15 mg Intravenous Given 12/25/15 2343)    DIAGNOSTIC STUDIES: Oxygen Saturation is 98% on RA, normal by my interpretation.    COORDINATION OF CARE: 11:15 PM-Discussed treatment plan which includes blood work, urinalysis and giving Zofran, Toradol, Valium, and Decadron with pt at bedside and pt agreed to plan.   12:52 AM - Pt is sleeping and seems more comfortable. Discussed discharge information with daughter and grand daughter of pt.   Patient got up to use the bathroom prior to leaving the ED  and states her pain is still there. She was given tramadol that she takes for pain at home.  Labs Review Results for orders placed or performed during the hospital encounter of 12/25/15  Lipase, blood  Result Value Ref Range   Lipase 22 11 - 51 U/L  Comprehensive metabolic panel  Result Value Ref Range   Sodium 139 135 - 145 mmol/L   Potassium 3.5 3.5 - 5.1 mmol/L   Chloride 103 101 - 111 mmol/L   CO2 27 22 - 32 mmol/L   Glucose, Bld 99 65 - 99 mg/dL   BUN 12 6 - 20 mg/dL   Creatinine, Ser 1.61 (H) 0.44 - 1.00 mg/dL   Calcium 8.5 (L) 8.9 - 10.3 mg/dL   Total Protein 7.2 6.5 - 8.1 g/dL   Albumin 3.4 (L) 3.5 - 5.0 g/dL   AST 32 15 - 41 U/L   ALT 38 14 - 54 U/L   Alkaline Phosphatase 81 38 - 126 U/L   Total Bilirubin 0.9 0.3 - 1.2 mg/dL   GFR calc non Af Amer 48 (L) >60 mL/min   GFR calc Af Amer 55 (L) >60 mL/min   Anion gap 9 5 - 15  CBC  Result Value Ref Range   WBC 9.7 4.0 - 10.5 K/uL   RBC 4.16 3.87 - 5.11 MIL/uL   Hemoglobin 12.0 12.0 - 15.0 g/dL   HCT 09.6 04.5 - 40.9 %   MCV 89.4 78.0 - 100.0 fL   MCH 28.8 26.0 - 34.0 pg   MCHC 32.3 30.0 - 36.0 g/dL   RDW 81.1 91.4 - 78.2 %   Platelets 283 150 - 400 K/uL  Urinalysis, Routine w reflex microscopic (not at [facility])  Result Value Ref Range   Color, Urine YELLOW YELLOW   APPearance HAZY (A) CLEAR   Specific Gravity, Urine 1.010 1.005 - 1.030   pH 7.0 5.0 - 8.0   Glucose, UA NEGATIVE NEGATIVE mg/dL   Hgb urine dipstick NEGATIVE NEGATIVE   Bilirubin Urine NEGATIVE NEGATIVE   Ketones, ur NEGATIVE NEGATIVE mg/dL   Protein, ur NEGATIVE NEGATIVE mg/dL   Nitrite NEGATIVE NEGATIVE   Leukocytes, UA NEGATIVE NEGATIVE      Imaging Review Ct Abdomen Pelvis W Contrast  12/24/2015  CLINICAL DATA:  Abdominal pain for 2 days EXAM: CT ABDOMEN AND PELVIS WITH CONTRAST TECHNIQUE: Multidetector CT imaging of the abdomen and  pelvis was performed using the standard protocol following bolus administration of intravenous contrast.  CONTRAST:  OMNIPAQUE IOHEXOL 300 MG/ML  SOLN COMPARISON:  01/05/2013 FINDINGS: Lung bases are free of acute infiltrate or sizable effusion. The liver is diffusely decreased in attenuation consistent with fatty infiltration. The gallbladder is well distended. The spleen, adrenal glands and pancreas are within normal limits. Kidneys are well visualized bilaterally. No renal calculi or obstructive changes are seen. Cystic changes are noted on the left. No obstructive changes are seen. Aortoiliac calcifications are seen without aneurysmal dilatation. The uterus has been surgically removed. A left ovarian cyst is noted which measures 3.7 cm in greatest dimension. This corresponds to a cystic lesion seen on prior exam in 2014 and is stable in size. It was mildly complex on the prior ultrasound examination. The bony structures are within normal limits. The bladder is well distended. Diverticular change without diverticulitis is noted. IMPRESSION: Fatty liver. Left ovarian cyst which is stable in size from a prior ultrasound from 2014. This is likely of a benign etiology given its stability. Diverticulosis without diverticulitis. Electronically Signed   By: Alcide Clever M.D.   On: 12/24/2015 15:43   I have personally reviewed and evaluated these images and lab results as part of my medical decision-making.   EKG Interpretation None      MDM   Final diagnoses:  Right-sided thoracic back pain    New Prescriptions   CYCLOBENZAPRINE (FLEXERIL) 5 MG TABLET    Take 1 tablet (5 mg total) by mouth 3 (three) times daily as needed.   PREDNISONE (DELTASONE) 20 MG TABLET    Take 3 po QD x 3d , then 2 po QD x 3d then 1 po QD x 3d    Plan discharge  Devoria Albe, MD, FACEP   I personally performed the services described in this documentation, which was scribed in my presence. The recorded information has been reviewed and considered.   Devoria Albe, MD 12/26/15 1610  Devoria Albe, MD 12/26/15 [phone]

## 2015-12-26 MED ORDER — TRAMADOL HCL 50 MG PO TABS
50.0000 mg | ORAL_TABLET | Freq: Once | ORAL | Status: AC
  Administered 2015-12-26: 50 mg via ORAL
  Filled 2015-12-26: qty 1

## 2015-12-26 MED ORDER — PREDNISONE 20 MG PO TABS: ORAL_TABLET | ORAL | Status: DC

## 2015-12-26 MED ORDER — CYCLOBENZAPRINE HCL 5 MG PO TABS: 5.0000 mg | ORAL_TABLET | Freq: Three times a day (TID) | ORAL | Status: DC | PRN

## 2015-12-26 NOTE — Discharge Instructions (Signed)
Use ice and heat on your back for comfort. Take the medications as prescribed. Keep your appointment next week with Dr Romeo Apple.    Back Pain, Adult Back pain is very common. The pain often gets better over time. The cause of back pain is usually not dangerous. Most people can learn to manage their back pain on their own.  HOME CARE  Watch your back pain for any changes. The following actions may help to lessen any pain you are feeling:  Stay active. Start with short walks on flat ground if you can. Try to walk farther each day.  Exercise regularly as told by your doctor. Exercise helps your back heal faster. It also helps avoid future injury by keeping your muscles strong and flexible.  Do not sit, drive, or stand in one place for more than 30 minutes.  Do not stay in bed. Resting more than 1-2 days can slow down your recovery.  Be careful when you bend or lift an object. Use good form when lifting:  Bend at your knees.  Keep the object close to your body.  Do not twist.  Sleep on a firm mattress. Lie on your side, and bend your knees. If you lie on your back, put a pillow under your knees.  Take medicines only as told by your doctor.  Put ice on the injured area.  Put ice in a plastic bag.  Place a towel between your skin and the bag.  Leave the ice on for 20 minutes, 2-3 times a day for the first 2-3 days. After that, you can switch between ice and heat packs.  Avoid feeling anxious or stressed. Find good ways to deal with stress, such as exercise.  Maintain a healthy weight. Extra weight puts stress on your back. GET HELP IF:   You have pain that does not go away with rest or medicine.  You have worsening pain that goes down into your legs or buttocks.  You have pain that does not get better in one week.  You have pain at night.  You lose weight.  You have a fever or chills. GET HELP RIGHT AWAY IF:   You cannot control when you poop (bowel movement) or pee  (urinate).  Your arms or legs feel weak.  Your arms or legs lose feeling (numbness).  You feel sick to your stomach (nauseous) or throw up (vomit).  You have belly (abdominal) pain.  You feel like you may pass out (faint).   This information is not intended to replace advice given to you by your health care provider. Make sure you discuss any questions you have with your health care provider.   Document Released: 04/13/2008 Document Revised: 11/16/2014 Document Reviewed: 02/27/2014 Elsevier Interactive Patient Education 2016 Elsevier Inc.  [employer] Therapy Heat therapy can help ease sore, stiff, injured, and tight muscles and joints. Heat relaxes your muscles, which may help ease your pain. Heat therapy should only be used on old, pre-existing, or long-lasting (chronic) injuries. Do not use heat therapy unless told by your doctor. HOW TO USE HEAT THERAPY There are several different kinds of heat therapy, including:  Moist heat pack.  Warm water bath.  Hot water bottle.  Electric heating pad.  Heated gel pack.  Heated wrap.  Electric heating pad. GENERAL HEAT THERAPY RECOMMENDATIONS   Do not sleep while using heat therapy. Only use heat therapy while you are awake.  Your skin may turn pink while using heat therapy. Do not use  heat therapy if your skin turns red.  Do not use heat therapy if you have new pain.  High heat or long exposure to heat can cause burns. Be careful when using heat therapy to avoid burning your skin.  Do not use heat therapy on areas of your skin that are already irritated, such as with a rash or sunburn. GET HELP IF:   You have blisters, redness, swelling (puffiness), or numbness.  You have new pain.  Your pain is worse. MAKE SURE YOU:  Understand these instructions.  Will watch your condition.  Will get help right away if you are not doing well or get worse.   This information is not intended to replace advice given to you by your  health care provider. Make sure you discuss any questions you have with your health care provider.   Document Released: 01/18/2012 Document Revised: 11/16/2014 Document Reviewed: 12/19/2013 Elsevier Interactive Patient Education [employer]

## 2016-01-01 ENCOUNTER — Ambulatory Visit (INDEPENDENT_AMBULATORY_CARE_PROVIDER_SITE_OTHER): Payer: Medicare PPO | Admitting: Orthopaedic Surgery

## 2016-01-01 ENCOUNTER — Encounter: Payer: Self-pay | Admitting: Orthopaedic Surgery

## 2016-01-01 VITALS — BP 177/82 | HR 63 | Temp 97.9°F | Ht 67.0 in | Wt 172.2 lb

## 2016-01-01 DIAGNOSIS — M545 Low back pain: Secondary | ICD-10-CM | POA: Diagnosis not present

## 2016-01-01 NOTE — Patient Instructions (Signed)
We will schedule MRI for you and call you with appointment 

## 2016-01-01 NOTE — Progress Notes (Signed)
Patient Haley Nguyen, female DOB:10-26-1933, 82 y.o. EAV:[id number]  Chief Complaint  Patient presents with  . Back Pain    back pain and hurting on right side and pain radiating down right leg    HPI  Haley Nguyen is a 82 y.o. female who has history of lower back pain and right sided pain for about a month.  She has no history of fall.  She has been seen at [facility].  I have reviewed the notes.  She complains of lower back pain that is sharp, throbbing, stabbing and some radiation to the right buttocks.  It bothers her most of the time.  Pain is 5 of 10.  She is taking tramadol and gabapentin with slight help.  She has had x-rays which showed no fracture and marked degenerative changes of the lumbar spine.  She is referred her for further evaluation.  Her daughter accompanies her.  HPI  Body mass index is 26.96 kg/(m^2).   Review of Systems  Constitutional: Positive for fatigue.       Patient does not have Diabetes Mellitus. Patient has hypertension. Patient does not have COPD or shortness of breath. Patient does not have BMI > 35. Patient does not have current smoking history  Respiratory: Positive for shortness of breath.   Cardiovascular: Positive for leg swelling.  Gastrointestinal: Positive for nausea, vomiting, abdominal pain and constipation.  Musculoskeletal: Positive for myalgias and back pain.  Neurological: Positive for light-headedness.    Past Medical History  Diagnosis Date  . Arthritis   . Hypertension   . RLQ abdominal pain 05/29/2013    Past Surgical History  Procedure Laterality Date  . Abdominal hysterectomy    . Laparoscopic salpingo oopherectomy Bilateral 03/02/2013    Procedure: LAPAROSCOPIC SALPINGO OOPHORECTOMY;  Surgeon: Lazaro Arms, MD;  Location: [facility];  Service: Gynecology;  Laterality: Bilateral;  . Laparoscopic lysis of adhesions N/A 03/02/2013    Procedure: LAPAROSCOPIC LYSIS OF ADHESIONS;  Surgeon: Lazaro Arms, MD;  Location: [facility];  Service: Gynecology;  Laterality: N/A;    Family History  Problem Relation Age of Onset  . Heart disease Father   . Heart disease Mother     Social History Social History  Substance Use Topics  . Smoking status: Former Smoker -- 30 years    Types: Cigarettes  . Smokeless tobacco: Former Neurosurgeon    Types: Chew, Snuff  . Alcohol Use: 0.0 oz/week     Comment: occasional drink    No Known Allergies  Current Outpatient Prescriptions  Medication Sig Dispense Refill  . Alpha-D-Galactosidase (BEANO) TABS Take 1-2 tablets by mouth daily as needed (for flatulence).    Marland Kitchen amLODipine (NORVASC) 10 MG tablet Take 10 mg by mouth daily.    . cyclobenzaprine (FLEXERIL) 5 MG tablet Take 1 tablet (5 mg total) by mouth 3 (three) times daily as needed. 30 tablet 0  . docusate sodium (COLACE) 100 MG capsule Take 100-200 mg by mouth daily.     Marland Kitchen gabapentin (NEURONTIN) 600 MG tablet Take 600 mg by mouth 3 (three) times daily.    . Garlic 1000 MG CAPS Take 1 capsule by mouth daily.    . Magnesium Oxide (PHILLIPS) 500 MG (LAX) TABS Take 1-2 tablets by mouth daily.    . metoprolol (LOPRESSOR) 50 MG tablet Take 50 mg by mouth 2 (two) times daily.    . nitrofurantoin, macrocrystal-monohydrate, (MACROBID) 100 MG capsule Take 100 mg by mouth 2 (two) times  daily. 7 day course starting on 12/24/15    . Omega-3 Fatty Acids (FISH OIL) 1000 MG CAPS Take 1 capsule by mouth daily.    . polycarbophil (FIBERCON) 625 MG tablet Take 625-1,250 mg by mouth daily.     . predniSONE (DELTASONE) 20 MG tablet Take 3 po QD x 3d , then 2 po QD x 3d then 1 po QD x 3d 18 tablet 0  . promethazine (PHENERGAN) 25 MG tablet Take 25 mg by mouth every 6 (six) hours as needed for nausea or vomiting.     . simvastatin (ZOCOR) 40 MG tablet Take 20 mg by mouth every evening.    . traMADol (ULTRAM) 50 MG tablet Take 50 mg by mouth every 6 (six) hours as needed for pain.     No current facility-administered  medications for this visit.     Physical Exam  Blood pressure 177/82, pulse 63, temperature 97.9 F (36.6 C), height  (1.702 m), weight 172 lb 3.2 oz (78.109 kg).  Constitutional: overall normal hygiene, normal nutrition, well developed, normal grooming, normal body habitus. Assistive device:none  Musculoskeletal: gait and station Limp none, muscle tone and strength are normal, no tremors or atrophy is present.  .  Neurological: coordination overall normal.  Deep tendon reflex/nerve stretch intact.  Sensation normal.  Cranial nerves II-XII intact.   Skin:   normal overall no scars, lesions, ulcers or rashes. No psoriasis.  Psychiatric: Alert and oriented x 3.  Recent memory intact, remote memory unclear.  Normal mood and affect. Well groomed.  Good eye contact.  Cardiovascular: overall no swelling, no varicosities, no edema bilaterally, normal temperatures of the legs and arms, no clubbing, cyanosis and good capillary refill.  Lymphatic: palpation is normal.  Spine/Pelvis examination:  Inspection:  Overall, sacoiliac joint benign and hips nontender; without crepitus or defects.   Thoracic spine inspection: Alignment normal without kyphosis present   Lumbar spine inspection:  Alignment  with normal lumbar lordosis, without scoliosis apparent.   Thoracic spine palpation:  without tenderness of spinal processes   Lumbar spine palpation: with tenderness of lumbar area; without tightness of lumbar muscles    Range of Motion:   Lumbar flexion, forward flexion is full  with pain or tenderness    Lumbar extension is 5  with pain or tenderness   Left lateral bend is Normal  without pain or tenderness   Right lateral bend is Normal without pain or tenderness   Straight leg raising is Normal   Strength & tone: Normal   Stability overall normal stability    Extremities:Upper and lower are negative Inspection normal upper and lower extremities Strength and tone normal upper  and lower extremities Range of motion full upper and lower extremities   She has some tenderness of the right lower rib cage laterally.  Additional services performed: I reviewed the notes from [facility].  I reviewed the x-rays and the x-ray report of the lumbar spine.  She has significant degenerative joint disease of the lumbar spine.    I will get a MRI of the back.  I have talked to her daughter about the pain the patient has and what is planned.  The patient has history of some early dementia but appears fully oriented to me today and answers appropriately.  The patient has been educated about the nature of the problem(s) and counseled on treatment options.  The patient appeared to understand what I have discussed and is in agreement with  it.  PLAN Call if any problems.  Precautions discussed.  Continue current medications.   Return to clinic after MRI of the lumbar spine

## 2016-01-06 ENCOUNTER — Telehealth: Payer: Self-pay | Admitting: Orthopaedic Surgery

## 2016-01-06 NOTE — Telephone Encounter (Signed)
Pt was wanting to know if we have her MRI scheduled

## 2016-01-16 ENCOUNTER — Telehealth: Payer: Self-pay | Admitting: *Deleted

## 2016-01-16 NOTE — Telephone Encounter (Signed)
MRI APPROVED PER [employer] AUTHR # [id number] VALID THROUGH 01/16/16 - 02/15/16

## 2016-01-16 NOTE — Telephone Encounter (Signed)
PATIENT HAS AN APPOINTMENT FOR MRI 01/28/16 AT 1:00 PM, PATIENT TO ARRIVE AT 12;45 PM. FOLLOW UP APPOINTMENT WITH DR. Richardson ChiquitoKEELING SCHEDULED FOR 02/04/16 AT 10:20 AM

## 2016-01-17 NOTE — Telephone Encounter (Signed)
Patient called back - received MRI appointment information for 01/28/16, 1:00pm, and also for follow up visit, although has re-scheduled date of follow up appointment to 01/30/16.

## 2016-01-20 NOTE — Telephone Encounter (Signed)
Noted  

## 2016-01-28 ENCOUNTER — Other Ambulatory Visit: Payer: Self-pay | Admitting: *Deleted

## 2016-01-28 ENCOUNTER — Telehealth: Payer: Self-pay | Admitting: Orthopaedic Surgery

## 2016-01-28 ENCOUNTER — Ambulatory Visit (HOSPITAL_COMMUNITY)
Admission: RE | Admit: 2016-01-28 | Discharge: 2016-01-28 | Disposition: A | Discharge status: Home / Self Care | Payer: Medicare PPO | Source: Ambulatory Visit | Attending: Orthopaedic Surgery | Admitting: Orthopaedic Surgery

## 2016-01-28 DIAGNOSIS — M545 Low back pain: Secondary | ICD-10-CM

## 2016-01-28 DIAGNOSIS — M5441 Lumbago with sciatica, right side: Secondary | ICD-10-CM

## 2016-01-28 NOTE — Telephone Encounter (Signed)
Questions.  Shane from Radiology called and spoke with Dr. Hilda LiasKeeling. Ms Haley Nguyen needs to get a CT instead of MRI, because of having metal. They need a order for the CT and wants to find out about the pre-cert. Please call and speak to someone at [phone].

## 2016-01-29 ENCOUNTER — Telehealth: Payer: Self-pay | Admitting: *Deleted

## 2016-01-29 NOTE — Telephone Encounter (Signed)
CT Scan approved AUTHR # [id number] per Ulyses JarredBeatrice C with [facility]. Scheduled for 01/31/16 at 8:30 am Follow up scheduled for 02/05/16 at 2:50 pm Left appointment dates and times on voicemail, unable to reach in person despite various attempts.

## 2016-01-29 NOTE — Telephone Encounter (Signed)
CT scheduled and left message for patient.

## 2016-01-30 ENCOUNTER — Ambulatory Visit: Payer: Medicare PPO | Admitting: Orthopaedic Surgery

## 2016-01-31 ENCOUNTER — Ambulatory Visit (HOSPITAL_COMMUNITY)
Admission: RE | Admit: 2016-01-31 | Discharge: 2016-01-31 | Disposition: A | Discharge status: Home / Self Care | Payer: Medicare PPO | Source: Ambulatory Visit | Attending: Orthopaedic Surgery | Admitting: Orthopaedic Surgery

## 2016-01-31 DIAGNOSIS — M129 Arthropathy, unspecified: Secondary | ICD-10-CM | POA: Diagnosis not present

## 2016-01-31 DIAGNOSIS — M5441 Lumbago with sciatica, right side: Secondary | ICD-10-CM | POA: Diagnosis not present

## 2016-01-31 DIAGNOSIS — M4806 Spinal stenosis, lumbar region: Secondary | ICD-10-CM | POA: Diagnosis not present

## 2016-01-31 DIAGNOSIS — M5126 Other intervertebral disc displacement, lumbar region: Secondary | ICD-10-CM | POA: Diagnosis not present

## 2016-02-04 ENCOUNTER — Ambulatory Visit: Payer: Medicare PPO | Admitting: Orthopaedic Surgery

## 2016-02-05 ENCOUNTER — Encounter: Payer: Self-pay | Admitting: Orthopaedic Surgery

## 2016-02-05 ENCOUNTER — Ambulatory Visit (INDEPENDENT_AMBULATORY_CARE_PROVIDER_SITE_OTHER): Payer: Medicare PPO | Admitting: Orthopaedic Surgery

## 2016-02-05 VITALS — BP 149/68 | HR 61 | Temp 97.7°F | Ht 67.0 in | Wt 168.0 lb

## 2016-02-05 DIAGNOSIS — M545 Low back pain: Secondary | ICD-10-CM

## 2016-02-05 NOTE — Progress Notes (Signed)
Patient ZH:YQMV Haley Nguyen, female DOB:11-Dec-1932, 82 y.o. HQI:[id number]  Chief Complaint  Patient presents with  . Back Pain    Follow up CT results    HPI  Haley Nguyen is a 82 y.o. female who is in follow-up for low back pain.  She was scheduled to get a MRI of the lumbar spine but she had ear surgery over 40 years ago and had a device implanted.  It is unknown what it is.  So the MRI was cancelled and a CT scan of the lumbar spine was done.    The CT scan of the lumbar spine shows:  IMPRESSION: Extensive multilevel arthropathy. Levoscoliosis is noted in the mid lumbar region. No fracture or spondylolisthesis.  There is moderately severe spinal stenosis at the inferior aspect of L3-4 and at L4-5, multifactorial. At L4-5, spinal stenosis is due to diffuse disc protrusion as well as severe facet and ligamentum flavum hypertrophy causing generalize canal narrowing. There is impression on the exiting nerve roots bilaterally due to the diffuse disc protrusion at L4-5. At L3-4, there is marked asymmetric disc protrusion with possible extrusion which effaces the exiting nerve root on the right with far lateral disc protrusion on the left more inferiorly which impresses upon the exiting nerve root far laterally on the left at L3-4. Central canal stenosis is noted due to the more inferior disc protrusion coupled with bony hypertrophy at L3-4.  Other areas of exit foraminal narrowing are due to bony hypertrophy with associated disc bulging/ protrusion.  Note that there are 5 non-rib-bearing lumbar type vertebral bodies with markedly hypoplastic T12 ribs.  She continues to have right sided sciatica type pain.  Some days are worse than others.  Today she is doing much better.  Yesterday she could hardly get out of bed without pain.  She has no new trauma.  She has no bowel or bladder problem or marked weakness. HPI  Body mass index is 26.31 kg/(m^2).  Review of Systems   Constitutional: Positive for fatigue.       Patient does not have Diabetes Mellitus. Patient has hypertension. Patient does not have COPD or shortness of breath. Patient does not have BMI > 35. Patient does not have current smoking history  HENT: Negative for congestion.   Respiratory: Positive for shortness of breath.   Cardiovascular: Positive for leg swelling.  Gastrointestinal: Positive for nausea, vomiting, abdominal pain and constipation.  Endocrine: Negative for cold intolerance.  Musculoskeletal: Positive for myalgias and back pain.  Neurological: Positive for light-headedness.    Past Medical History  Diagnosis Date  . Arthritis   . Hypertension   . RLQ abdominal pain 05/29/2013    Past Surgical History  Procedure Laterality Date  . Abdominal hysterectomy    . Laparoscopic salpingo oopherectomy Bilateral 03/02/2013    Procedure: LAPAROSCOPIC SALPINGO OOPHORECTOMY;  Surgeon: Lazaro Arms, MD;  Location: [facility];  Service: Gynecology;  Laterality: Bilateral;  . Laparoscopic lysis of adhesions N/A 03/02/2013    Procedure: LAPAROSCOPIC LYSIS OF ADHESIONS;  Surgeon: Lazaro Arms, MD;  Location: [facility];  Service: Gynecology;  Laterality: N/A;    Family History  Problem Relation Age of Onset  . Heart disease Father   . Heart disease Mother     Social History Social History  Substance Use Topics  . Smoking status: Former Smoker -- 30 years    Types: Cigarettes  . Smokeless tobacco: Former Neurosurgeon    Types: Chew, Snuff  . Alcohol Use: 0.0  oz/week     Comment: occasional drink    No Known Allergies  Current Outpatient Prescriptions  Medication Sig Dispense Refill  . Alpha-D-Galactosidase (BEANO) TABS Take 1-2 tablets by mouth daily as needed (for flatulence).    Marland Kitchen amLODipine (NORVASC) 10 MG tablet Take 10 mg by mouth daily.    . cyclobenzaprine (FLEXERIL) 5 MG tablet Take 1 tablet (5 mg total) by mouth 3 (three) times daily as needed. 30 tablet 0  . docusate sodium  (COLACE) 100 MG capsule Take 100-200 mg by mouth daily.     . Garlic 1000 MG CAPS Take 1 capsule by mouth daily.    . Magnesium Oxide (PHILLIPS) 500 MG (LAX) TABS Take 1-2 tablets by mouth daily.    . metoprolol (LOPRESSOR) 50 MG tablet Take 50 mg by mouth 2 (two) times daily.    . Omega-3 Fatty Acids (FISH OIL) 1000 MG CAPS Take 1 capsule by mouth daily.    . polycarbophil (FIBERCON) 625 MG tablet Take 625-1,250 mg by mouth daily.     . promethazine (PHENERGAN) 25 MG tablet Take 25 mg by mouth every 6 (six) hours as needed for nausea or vomiting.     . simvastatin (ZOCOR) 40 MG tablet Take 20 mg by mouth every evening.    . traMADol (ULTRAM) 50 MG tablet Take 50 mg by mouth every 6 (six) hours as needed for pain.     No current facility-administered medications for this visit.     Physical Exam  Blood pressure 149/68, pulse 61, temperature 97.7 F (36.5 C), temperature source Tympanic, height  (1.702 m), weight 168 lb (76.204 kg).  Constitutional: overall normal hygiene, normal nutrition, well developed, normal grooming, normal body habitus. Assistive device:none  Musculoskeletal: gait and station Limp none, muscle tone and strength are normal, no tremors or atrophy is present.  .  Neurological: coordination overall normal.  Deep tendon reflex/nerve stretch intact.  Sensation normal.  Cranial nerves II-XII intact.   Skin:   normal overall no scars, lesions, ulcers or rashes. No psoriasis.  Psychiatric: Alert and oriented x 3.  Recent memory intact, remote memory unclear.  Normal mood and affect. Well groomed.  Good eye contact.  Cardiovascular: overall no swelling, no varicosities, no edema bilaterally, normal temperatures of the legs and arms, no clubbing, cyanosis and good capillary refill.  Lymphatic: palpation is normal.  Spine/Pelvis examination:  Inspection:  Overall, sacoiliac joint benign and hips nontender; without crepitus or defects.   Thoracic spine inspection:  Alignment normal without kyphosis present   Lumbar spine inspection:  Alignment  with normal lumbar lordosis, without scoliosis apparent.   Thoracic spine palpation:  without tenderness of spinal processes   Lumbar spine palpation: with tenderness of lumbar area; without tightness of lumbar muscles    Range of Motion:   Lumbar flexion, forward flexion is 35  without pain or tenderness    Lumbar extension is 10  without pain or tenderness   Left lateral bend is Normal  without pain or tenderness   Right lateral bend is Normal without pain or tenderness   Straight leg raising is positive on the right at 25   Strength & tone: Normal   Stability overall normal stability    The patient has been educated about the nature of the problem(s) and counseled on treatment options.  The patient appeared to understand what I have discussed and is in agreement with it.  Encounter Diagnosis  Name Primary?  . Low back pain,  unspecified back pain laterality, with sciatica presence unspecified Yes    PLAN  I went over the findings of the CT scan with her and her daughter.  I would like for her to see a neurosurgeon and see if she is a candidate for epidural injection or even surgery.  She and family are agreeable to this.  Call if any problems.  Precautions discussed.  Continue current medications.   Return to clinic to see neurosurgeon

## 2016-02-10 ENCOUNTER — Telehealth: Payer: Self-pay | Admitting: *Deleted

## 2016-02-10 NOTE — Telephone Encounter (Signed)
Referral and office notes faxed to [facility]. Awaiting appointment. 

## 2016-02-17 NOTE — Telephone Encounter (Signed)
appt 02/18/16 @10 :30 with Dr Yetta BarreJones

## 2016-02-24 ENCOUNTER — Encounter (HOSPITAL_COMMUNITY): Payer: Self-pay

## 2016-06-28 ENCOUNTER — Emergency Department (HOSPITAL_COMMUNITY): Payer: Medicare PPO

## 2016-06-28 ENCOUNTER — Encounter (HOSPITAL_COMMUNITY): Payer: Self-pay | Admitting: Emergency Medicine

## 2016-06-28 ENCOUNTER — Emergency Department (HOSPITAL_COMMUNITY)
Admission: EM | Admit: 2016-06-28 | Discharge: 2016-06-28 | Disposition: A | Discharge status: Home / Self Care | Payer: Medicare PPO | Attending: Emergency Medicine | Admitting: Emergency Medicine

## 2016-06-28 DIAGNOSIS — Z79899 Other long term (current) drug therapy: Secondary | ICD-10-CM | POA: Diagnosis not present

## 2016-06-28 DIAGNOSIS — W1789XA Other fall from one level to another, initial encounter: Secondary | ICD-10-CM | POA: Insufficient documentation

## 2016-06-28 DIAGNOSIS — Y929 Unspecified place or not applicable: Secondary | ICD-10-CM | POA: Insufficient documentation

## 2016-06-28 DIAGNOSIS — Y939 Activity, unspecified: Secondary | ICD-10-CM | POA: Insufficient documentation

## 2016-06-28 DIAGNOSIS — I10 Essential (primary) hypertension: Secondary | ICD-10-CM | POA: Insufficient documentation

## 2016-06-28 DIAGNOSIS — M546 Pain in thoracic spine: Secondary | ICD-10-CM | POA: Insufficient documentation

## 2016-06-28 DIAGNOSIS — Z87891 Personal history of nicotine dependence: Secondary | ICD-10-CM | POA: Diagnosis not present

## 2016-06-28 DIAGNOSIS — Y999 Unspecified external cause status: Secondary | ICD-10-CM | POA: Insufficient documentation

## 2016-06-28 DIAGNOSIS — R52 Pain, unspecified: Secondary | ICD-10-CM

## 2016-06-28 MED ORDER — TRAMADOL HCL 50 MG PO TABS
50.0000 mg | ORAL_TABLET | Freq: Once | ORAL | Status: AC
  Administered 2016-06-28: 50 mg via ORAL
  Filled 2016-06-28: qty 1

## 2016-06-28 MED ORDER — CYCLOBENZAPRINE HCL 5 MG PO TABS: 5.0000 mg | ORAL_TABLET | Freq: Two times a day (BID) | ORAL | 0 refills | Status: DC | PRN

## 2016-06-28 NOTE — ED Provider Notes (Signed)
[facility] Provider Note   CSN: [id number] Arrival date & time: 06/28/16  1212     History   Chief Complaint Chief Complaint  Patient presents with  . Back Pain    HPI Haley Nguyen is a 82 y.o. female presenting with right-sided back pain. Pain is been present over the last 2 days. It is intermittent and worse with some movement. Pain seems to come in waves. Patient fell off of her porch 3 days ago where she missed a step. Seemed to skin her right knee and so she's not sure if she also injured her right back. 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 There is no chest pain or shortness of breath. There is some worsening of pain with inspiration but it is minimal. No abdominal pain. No hematuria, dysuria. Chronically has urinary incontinence but no new bowel incontinence. No weakness or numbness in her legs. Has been taking Tylenol with no relief. Has not taken tramadol in about 5 days because her cardiologist told her she needed to take it lasts due to her renal function.  HPI  Past Medical History:  Diagnosis Date  . Arthritis   . Hypertension   . RLQ abdominal pain 05/29/2013    Patient Active Problem List   Diagnosis Date Noted  . RLQ abdominal pain 05/29/2013    Past Surgical History:  Procedure Laterality Date  . ABDOMINAL HYSTERECTOMY    . LAPAROSCOPIC LYSIS OF ADHESIONS N/A 03/02/2013   Procedure: LAPAROSCOPIC LYSIS OF ADHESIONS;  Surgeon: Lazaro Arms, MD;  Location: [facility];  Service: Gynecology;  Laterality: N/A;  . LAPAROSCOPIC SALPINGO OOPHERECTOMY Bilateral 03/02/2013   Procedure: LAPAROSCOPIC SALPINGO OOPHORECTOMY;  Surgeon: Lazaro Arms, MD;  Location: [facility];  Service: Gynecology;  Laterality: Bilateral;    OB History    Gravida Para Term Preterm AB Living   1 1       1    SAB TAB Ectopic Multiple Live Births           1       Home Medications    Prior to Admission  medications   Medication Sig Start Date End Date Taking? Authorizing Provider  Alpha-D-Galactosidase Charlyne Quale) TABS Take 1-2 tablets by mouth daily as needed (for flatulence).   Yes Historical Provider, MD  amLODipine (NORVASC) 10 MG tablet Take 10 mg by mouth daily.   Yes Historical Provider, MD  diphenhydrAMINE (BENADRYL) 25 mg capsule Take 25 mg by mouth every 6 (six) hours as needed for allergies.   Yes Historical Provider, MD  docusate sodium (COLACE) 100 MG capsule Take 100-200 mg by mouth daily.    Yes Historical Provider, MD  Garlic 1000 MG CAPS Take 1 capsule by mouth daily.   Yes Historical Provider, MD  Magnesium Oxide (PHILLIPS) 500 MG (LAX) TABS Take 1-2 tablets by mouth daily.   Yes Historical Provider, MD  metoprolol (LOPRESSOR) 50 MG tablet Take 50 mg by mouth 2 (two) times daily.   Yes Historical Provider, MD  Omega-3 Fatty Acids (FISH OIL) 1000 MG CAPS Take 1 capsule by mouth daily.   Yes Historical Provider, MD  polycarbophil (FIBERCON) 625 MG tablet Take 625-1,250 mg by mouth daily.    Yes Historical Provider, MD  promethazine (PHENERGAN) 25 MG tablet Take 25 mg by mouth every 6 (six) hours as needed for nausea or vomiting.  12/24/15  Yes Historical Provider, MD  simvastatin (ZOCOR) 40  MG tablet Take 20 mg by mouth every evening.   Yes Historical Provider, MD  traMADol (ULTRAM) 50 MG tablet Take 50 mg by mouth every 6 (six) hours as needed for pain.   Yes Historical Provider, MD  cyclobenzaprine (FLEXERIL) 5 MG tablet Take 1 tablet (5 mg total) by mouth 2 (two) times daily as needed for muscle spasms. 06/28/16   Pricilla LovelessScott Dezirae Service, MD    Family History Family History  Problem Relation Age of Onset  . Heart disease Father   . Heart disease Mother     Social History Social History  Substance Use Topics  . Smoking status: Former Smoker    Years: 30.00    Types: Cigarettes  . Smokeless tobacco: Former NeurosurgeonUser    Types: Chew, Snuff  . Alcohol use 0.0 oz/week     Comment:  occasional drink     Allergies   Review of patient's allergies indicates no known allergies.   Review of Systems Review of Systems  Constitutional: Negative for fever.  Respiratory: Negative for shortness of breath.   Cardiovascular: Negative for chest pain.  Gastrointestinal: Negative for abdominal pain.  Genitourinary: Negative for dysuria and hematuria.  Musculoskeletal: Positive for back pain.  Neurological: Negative for weakness and numbness.  All other systems reviewed and are negative.    Physical Exam Updated Vital Signs BP 164/71 (BP Location: Left Arm)   Pulse 74   Temp 97.3 F (36.3 C) (Oral)   Resp 18   Ht 5\' 7"  (1.702 m)   Wt 168 lb (76.2 kg)   SpO2 100%   BMI 26.31 kg/m   Physical Exam  Constitutional: She is oriented to person, place, and time. She appears well-developed and well-nourished. No distress.  HENT:  Head: Normocephalic and atraumatic.  Right Ear: External ear normal.  Left Ear: External ear normal.  Nose: Nose normal.  Eyes: Right eye exhibits no discharge. Left eye exhibits no discharge.  Cardiovascular: Normal rate, regular rhythm and normal heart sounds.   Pulmonary/Chest: Effort normal and breath sounds normal.  Abdominal: Soft. She exhibits no distension. There is no tenderness.  Musculoskeletal:       Thoracic back: She exhibits tenderness. She exhibits no bony tenderness.       Back:  Tenderness to right paraspinal mid-thoracic back. Also tenderness to right lateral back inferior to scapula  Neurological: She is alert and oriented to person, place, and time.  Reflex Scores:      Patellar reflexes are 2+ on the right side and 2+ on the left side. 5/5 strength in BLE. Normal gross sensation. Moving right leg causes pain in back but strength is symmetric. Normal gait  Skin: Skin is warm and dry. She is not diaphoretic.  Nursing note and vitals reviewed.    ED Treatments / Results  Labs (all labs ordered are listed, but only  abnormal results are displayed) Labs Reviewed - No data to display  EKG  EKG Interpretation None       Radiology Dg Ribs Unilateral W/chest Right  Result Date: 06/28/2016 CLINICAL DATA:  Larey SeatFell from porch 3 days ago. Right anterior rib pain. EXAM: RIGHT RIBS AND CHEST - 3+ VIEW COMPARISON:  None. FINDINGS: Heart size is normal. Aortic atherosclerosis. Lungs are clear. No pneumothorax or hemothorax. Right rib films do not show a right-sided rib fracture. IMPRESSION: No active disease.  No rib fracture seen.  Aortic atherosclerosis. Electronically Signed   By: Paulina FusiMark  Shogry M.D.   On: 06/28/2016 13:32  Dg Thoracic Spine W/swimmers  Result Date: 06/28/2016 CLINICAL DATA:  Larey SeatFell from porch 3 days ago. Back pain and right rib pain. EXAM: THORACIC SPINE - 3 VIEWS COMPARISON:  None. FINDINGS: Mild thoracic curvature convex to the right. No evidence of thoracic spine fracture. Aortic atherosclerosis incidentally noted. IMPRESSION: Negative. Aortic atherosclerosis. Electronically Signed   By: Paulina FusiMark  Shogry M.D.   On: 06/28/2016 13:31    Procedures Procedures (including critical care time)  Medications Ordered in ED Medications  traMADol (ULTRAM) tablet 50 mg (50 mg Oral Given 06/28/16 1249)     Initial Impression / Assessment and Plan / ED Course  I have reviewed the triage vital signs and the nursing notes.  Pertinent labs & imaging results that were available during my care of the patient were reviewed by me and considered in my medical decision making (see chart for details).  Clinical Course  Comment By Time  Will give tramadol, x-ray T-spine and right ribs. No urinary symptoms. Given point tenderness I think this is all musculoskeletal and do not think a workup as needed for lung pathology or intra-abdominal/retroperitoneal pathology. No abdominal tenderness. Pricilla LovelessScott Naithen Rivenburg, MD 08/20 1236  Xrays unremarkable. Will try low dose flexeril at home. Counseled on not taking the tramadol at same  time and being careful of additive effects. F/u with PCP in 3 days if not improving. I think this is likely a muscular issue. Neuro exam benign. Pricilla LovelessScott Alie Moudy, MD 08/20 1341    Final Clinical Impressions(s) / ED Diagnoses   Final diagnoses:  Right-sided thoracic back pain    New Prescriptions New Prescriptions   CYCLOBENZAPRINE (FLEXERIL) 5 MG TABLET    Take 1 tablet (5 mg total) by mouth 2 (two) times daily as needed for muscle spasms.     Pricilla LovelessScott Kariel Skillman, MD 06/28/16 1343

## 2016-06-28 NOTE — ED Triage Notes (Signed)
Pt reports right back pain x3 days after falling on Thursday off of porch appx 1-2 foot drop.  Denies head injury or LOC. Pt denies any new urinary symptoms.

## 2016-06-29 ENCOUNTER — Emergency Department (HOSPITAL_COMMUNITY): Payer: Medicare PPO

## 2016-06-29 ENCOUNTER — Encounter (HOSPITAL_COMMUNITY): Payer: Self-pay | Admitting: Emergency Medicine

## 2016-06-29 ENCOUNTER — Observation Stay (HOSPITAL_COMMUNITY)
Admission: EM | Admit: 2016-06-29 | Discharge: 2016-06-30 | Disposition: A | Discharge status: Home / Self Care | Payer: Medicare PPO | Attending: Internal Medicine | Admitting: Internal Medicine

## 2016-06-29 DIAGNOSIS — R0789 Other chest pain: Principal | ICD-10-CM | POA: Insufficient documentation

## 2016-06-29 DIAGNOSIS — R269 Unspecified abnormalities of gait and mobility: Secondary | ICD-10-CM | POA: Insufficient documentation

## 2016-06-29 DIAGNOSIS — Z79899 Other long term (current) drug therapy: Secondary | ICD-10-CM | POA: Diagnosis not present

## 2016-06-29 DIAGNOSIS — I10 Essential (primary) hypertension: Secondary | ICD-10-CM | POA: Diagnosis not present

## 2016-06-29 DIAGNOSIS — R531 Weakness: Secondary | ICD-10-CM

## 2016-06-29 DIAGNOSIS — R079 Chest pain, unspecified: Secondary | ICD-10-CM

## 2016-06-29 DIAGNOSIS — Z87891 Personal history of nicotine dependence: Secondary | ICD-10-CM | POA: Diagnosis not present

## 2016-06-29 DIAGNOSIS — W108XXA Fall (on) (from) other stairs and steps, initial encounter: Secondary | ICD-10-CM

## 2016-06-29 HISTORY — DX: Essential (primary) hypertension: I10

## 2016-06-29 LAB — CBC
HCT: 42.4 % (ref 36.0–46.0)
Hemoglobin: 14 g/dL (ref 12.0–15.0)
MCH: 29.3 pg (ref 26.0–34.0)
MCHC: 33 g/dL (ref 30.0–36.0)
MCV: 88.7 fL (ref 78.0–100.0)
Platelets: 273 10*3/uL (ref 150–400)
RBC: 4.78 MIL/uL (ref 3.87–5.11)
RDW: 13.1 % (ref 11.5–15.5)
WBC: 6.4 10*3/uL (ref 4.0–10.5)

## 2016-06-29 LAB — BASIC METABOLIC PANEL
Anion gap: 4 — ABNORMAL LOW (ref 5–15)
BUN: 24 mg/dL — ABNORMAL HIGH (ref 6–20)
CO2: 27 mmol/L (ref 22–32)
Calcium: 8.5 mg/dL — ABNORMAL LOW (ref 8.9–10.3)
Chloride: 107 mmol/L (ref 101–111)
Creatinine, Ser: 1.12 mg/dL — ABNORMAL HIGH (ref 0.44–1.00)
GFR calc Af Amer: 52 mL/min — ABNORMAL LOW (ref >60)
GFR calc non Af Amer: 45 mL/min — ABNORMAL LOW (ref >60)
Glucose, Bld: 101 mg/dL — ABNORMAL HIGH (ref 65–99)
Potassium: 3.6 mmol/L (ref 3.5–5.1)
Sodium: 138 mmol/L (ref 135–145)

## 2016-06-29 LAB — TROPONIN I
Troponin I: 0.06 ng/mL (ref <0.03)
Troponin I: 0.06 ng/mL (ref <0.03)

## 2016-06-29 LAB — D-DIMER, QUANTITATIVE: D-Dimer, Quant: 0.54 ug/mL-FEU — ABNORMAL HIGH (ref 0.00–0.50)

## 2016-06-29 LAB — MAGNESIUM: Magnesium: 2.4 mg/dL (ref 1.7–2.4)

## 2016-06-29 MED ORDER — METOPROLOL TARTRATE 50 MG PO TABS
50.0000 mg | ORAL_TABLET | Freq: Two times a day (BID) | ORAL | Status: DC
  Administered 2016-06-29 – 2016-06-30 (×2): 50 mg via ORAL
  Filled 2016-06-29 (×2): qty 1

## 2016-06-29 MED ORDER — SIMVASTATIN 20 MG PO TABS
20.0000 mg | ORAL_TABLET | Freq: Every evening | ORAL | Status: DC
  Administered 2016-06-30: 20 mg via ORAL
  Filled 2016-06-29: qty 1

## 2016-06-29 MED ORDER — CALCIUM POLYCARBOPHIL 625 MG PO TABS
625.0000 mg | ORAL_TABLET | Freq: Every day | ORAL | Status: DC
  Administered 2016-06-30: 625 mg via ORAL
  Filled 2016-06-29 (×2): qty 2

## 2016-06-29 MED ORDER — IOPAMIDOL (ISOVUE-370) INJECTION 76%
75.0000 mL | Freq: Once | INTRAVENOUS | Status: AC | PRN
  Administered 2016-06-29: 75 mL via INTRAVENOUS

## 2016-06-29 MED ORDER — AMLODIPINE BESYLATE 5 MG PO TABS
10.0000 mg | ORAL_TABLET | Freq: Every day | ORAL | Status: DC
  Administered 2016-06-30: 10 mg via ORAL
  Filled 2016-06-29: qty 2

## 2016-06-29 MED ORDER — MAGNESIUM OXIDE 400 (241.3 MG) MG PO TABS
800.0000 mg | ORAL_TABLET | Freq: Every day | ORAL | Status: DC
  Administered 2016-06-30: 800 mg via ORAL
  Filled 2016-06-29: qty 2

## 2016-06-29 MED ORDER — ONDANSETRON HCL 4 MG/2ML IJ SOLN: 4.0000 mg | Freq: Four times a day (QID) | INTRAMUSCULAR | Status: DC | PRN

## 2016-06-29 MED ORDER — PANTOPRAZOLE SODIUM 40 MG PO TBEC
40.0000 mg | DELAYED_RELEASE_TABLET | Freq: Every day | ORAL | Status: DC
  Administered 2016-06-30: 40 mg via ORAL
  Filled 2016-06-29: qty 1

## 2016-06-29 MED ORDER — CYCLOBENZAPRINE HCL 10 MG PO TABS
5.0000 mg | ORAL_TABLET | Freq: Two times a day (BID) | ORAL | Status: DC | PRN
  Filled 2016-06-29: qty 1

## 2016-06-29 MED ORDER — ACETAMINOPHEN 325 MG PO TABS: 650.0000 mg | ORAL_TABLET | ORAL | Status: DC | PRN

## 2016-06-29 MED ORDER — ENOXAPARIN SODIUM 80 MG/0.8ML ~~LOC~~ SOLN: 1.0000 mg/kg | Freq: Once | SUBCUTANEOUS | Status: DC

## 2016-06-29 MED ORDER — HYDROMORPHONE HCL 1 MG/ML IJ SOLN
0.5000 mg | Freq: Once | INTRAMUSCULAR | Status: AC
  Administered 2016-06-29: 0.5 mg via INTRAVENOUS
  Filled 2016-06-29: qty 1

## 2016-06-29 MED ORDER — LORAZEPAM 2 MG/ML IJ SOLN
0.5000 mg | Freq: Once | INTRAMUSCULAR | Status: AC
  Administered 2016-06-29: 0.5 mg via INTRAVENOUS
  Filled 2016-06-29: qty 1

## 2016-06-29 MED ORDER — HYDROCODONE-ACETAMINOPHEN 5-325 MG PO TABS
1.0000 | ORAL_TABLET | ORAL | Status: DC | PRN
  Filled 2016-06-29: qty 1

## 2016-06-29 MED ORDER — ENOXAPARIN SODIUM 80 MG/0.8ML ~~LOC~~ SOLN
80.0000 mg | Freq: Once | SUBCUTANEOUS | Status: AC
  Administered 2016-06-29: 80 mg via SUBCUTANEOUS
  Filled 2016-06-29: qty 0.8

## 2016-06-29 MED ORDER — DOCUSATE SODIUM 100 MG PO CAPS
200.0000 mg | ORAL_CAPSULE | Freq: Two times a day (BID) | ORAL | Status: DC
  Administered 2016-06-30: 200 mg via ORAL
  Filled 2016-06-29: qty 2

## 2016-06-29 MED ORDER — ASPIRIN 325 MG PO TABS
325.0000 mg | ORAL_TABLET | Freq: Every day | ORAL | Status: DC
  Administered 2016-06-30: 325 mg via ORAL
  Filled 2016-06-29: qty 1

## 2016-06-29 MED ORDER — SODIUM CHLORIDE 0.9 % IV SOLN
INTRAVENOUS | Status: DC
  Administered 2016-06-29: via INTRAVENOUS

## 2016-06-29 MED ORDER — OMEGA-3-ACID ETHYL ESTERS 1 G PO CAPS
1.0000 g | ORAL_CAPSULE | Freq: Every day | ORAL | Status: DC
  Administered 2016-06-30: 1 g via ORAL
  Filled 2016-06-29: qty 1

## 2016-06-29 MED ORDER — KETOROLAC TROMETHAMINE 30 MG/ML IJ SOLN
15.0000 mg | Freq: Once | INTRAMUSCULAR | Status: AC
  Administered 2016-06-29: 15 mg via INTRAVENOUS
  Filled 2016-06-29: qty 1

## 2016-06-29 MED ORDER — ASPIRIN 81 MG PO CHEW
324.0000 mg | CHEWABLE_TABLET | Freq: Once | ORAL | Status: AC
  Administered 2016-06-29: 324 mg via ORAL
  Filled 2016-06-29: qty 4

## 2016-06-29 NOTE — H&P (Signed)
History and Physical  Haley MathRuth L Nguyen YQM:[id number] DOB: February 22, 1933 DOA: 06/29/2016  Referring physician: ER Physician PCP: Smith RobertStephen Kikel, MD  Outpatient Specialists:    Patient coming from: Home  Chief Complaint: Chest pain  HPI: 82 year old female with history of HTN and arthritis. Patient is said to have been falling lately. Patient presents with left sided chest pain with no associated nausea or diaphoresis. Patient reports associated pain on deep breathing. Patient is said to have fallen at home about 2 days ago. No headache, no neck pain, no GI symptoms or urinary symptom.  ED Course: EKG. CTA chest ordered. D Dimer ordered.  Pertinent labs: Troponin is 0.06.  EKG: Independently reviewed. Imaging: independently reviewed.  Review of Systems:  As oin HPI. Negative for fever, visual changes, sore throat, rash, new muscle aches, chest pain, SOB, dysuria, bleeding, n/v/abdominal pain.  Past Medical History:  Diagnosis Date  . Arthritis   . Hypertension   . RLQ abdominal pain 05/29/2013    Past Surgical History:  Procedure Laterality Date  . ABDOMINAL HYSTERECTOMY    . LAPAROSCOPIC LYSIS OF ADHESIONS N/A 03/02/2013   Procedure: LAPAROSCOPIC LYSIS OF ADHESIONS;  Surgeon: Lazaro ArmsLuther H Eure, MD;  Location: [facility];  Service: Gynecology;  Laterality: N/A;  . LAPAROSCOPIC SALPINGO OOPHERECTOMY Bilateral 03/02/2013   Procedure: LAPAROSCOPIC SALPINGO OOPHORECTOMY;  Surgeon: Lazaro ArmsLuther H Eure, MD;  Location: [facility];  Service: Gynecology;  Laterality: Bilateral;     reports that she has quit smoking. Her smoking use included Cigarettes. She quit after 30.00 years of use. She has quit using smokeless tobacco. Her smokeless tobacco use included Chew and Snuff. She reports that she drinks alcohol. She reports that she does not use drugs.  No Known Allergies  Family History  Problem Relation Age of Onset  . Heart disease Father   . Heart disease Mother      Prior to Admission medications     Medication Sig Start Date End Date Taking? Authorizing Provider  acetaminophen (TYLENOL) 500 MG tablet Take 500 mg by mouth every 6 (six) hours as needed for mild pain or moderate pain.   Yes Historical Provider, MD  Alpha-D-Galactosidase Charlyne Quale(BEANO) TABS Take 1-2 tablets by mouth daily as needed (for flatulence).   Yes Historical Provider, MD  amLODipine (NORVASC) 10 MG tablet Take 10 mg by mouth every morning.    Yes Historical Provider, MD  cyclobenzaprine (FLEXERIL) 5 MG tablet Take 1 tablet (5 mg total) by mouth 2 (two) times daily as needed for muscle spasms. 06/28/16  Yes Pricilla LovelessScott Goldston, MD  diphenhydrAMINE (BENADRYL) 25 mg capsule Take 25 mg by mouth every 6 (six) hours as needed for allergies.   Yes Historical Provider, MD  docusate sodium (COLACE) 100 MG capsule Take 200 mg by mouth 2 (two) times daily.    Yes Historical Provider, MD  esomeprazole (NEXIUM) 20 MG capsule Take 20 mg by mouth daily at 12 noon.   Yes Historical Provider, MD  Garlic 1000 MG CAPS Take 1 capsule by mouth daily.   Yes Historical Provider, MD  Magnesium Oxide (PHILLIPS) 500 MG (LAX) TABS Take 1-2 tablets by mouth daily.   Yes Historical Provider, MD  metoprolol (LOPRESSOR) 50 MG tablet Take 50 mg by mouth 2 (two) times daily.   Yes Historical Provider, MD  Omega-3 Fatty Acids (FISH OIL) 1000 MG CAPS Take 1 capsule by mouth daily.   Yes Historical Provider, MD  polycarbophil (FIBERCON) 625 MG tablet Take 625-1,250 mg by mouth daily.  Yes Historical Provider, MD  promethazine (PHENERGAN) 25 MG tablet Take 25 mg by mouth every 6 (six) hours as needed for nausea or vomiting.  12/24/15  Yes Historical Provider, MD  simvastatin (ZOCOR) 40 MG tablet Take 20 mg by mouth every evening.   Yes Historical Provider, MD  traMADol (ULTRAM) 50 MG tablet Take 50 mg by mouth every 6 (six) hours as needed for pain.   Yes Historical Provider, MD    Physical Exam: Vitals:   06/29/16 1830 06/29/16 1930 06/29/16 2000 06/29/16 2100  BP:  134/62 (!) 125/49 132/60 128/59  Pulse: 66 66 68 66  Resp: 22 15 17 21   Temp:      TempSrc:      SpO2: 96% 99% 96% 95%   Constitutional:  . Appears calm and comfortable Eyes:  . No pallor. No jaundice.  ENMT:  . external ears, nose appear normal Neck:  . Neck is supple. No JVD Respiratory:  . CTA bilaterally, no w/r/r.  . Respiratory effort normal. No retractions or accessory muscle use Cardiovascular:  . S1S2 . No LE extremity edema   Abdomen:  . Abdomen is soft and non tender. Organs are difficult to assess. Neurologic:  . Awake and alert. . Moves all limbs.  Wt Readings from Last 3 Encounters:  06/28/16 76.2 kg (168 lb)  02/05/16 76.2 kg (168 lb)  01/01/16 78.1 kg (172 lb 3.2 oz)    I have personally reviewed following labs and imaging studies  Labs on Admission:  CBC:  Recent Labs Lab 06/29/16 1816  WBC 6.4  HGB 14.0  HCT 42.4  MCV 88.7  PLT 273   Basic Metabolic Panel:  Recent Labs Lab 06/29/16 1816  NA 138  K 3.6  CL 107  CO2 27  GLUCOSE 101*  BUN 24*  CREATININE 1.12*  CALCIUM 8.5*   Liver Function Tests: No results for input(s): AST, ALT, ALKPHOS, BILITOT, PROT, ALBUMIN in the last 168 hours. No results for input(s): LIPASE, AMYLASE in the last 168 hours. No results for input(s): AMMONIA in the last 168 hours. Coagulation Profile: No results for input(s): INR, PROTIME in the last 168 hours. Cardiac Enzymes:  Recent Labs Lab 06/29/16 1816  TROPONINI 0.06*   BNP (last 3 results) No results for input(s): PROBNP in the last 8760 hours. HbA1C: No results for input(s): HGBA1C in the last 72 hours. CBG: No results for input(s): GLUCAP in the last 168 hours. Lipid Profile: No results for input(s): CHOL, HDL, LDLCALC, TRIG, CHOLHDL, LDLDIRECT in the last 72 hours. Thyroid Function Tests: No results for input(s): TSH, T4TOTAL, FREET4, T3FREE, THYROIDAB in the last 72 hours. Anemia Panel: No results for input(s): VITAMINB12, FOLATE,  FERRITIN, TIBC, IRON, RETICCTPCT in the last 72 hours. Urine analysis:    Component Value Date/Time   COLORURINE YELLOW 12/25/2015 1935   APPEARANCEUR HAZY (A) 12/25/2015 1935   LABSPEC 1.010 12/25/2015 1935   PHURINE 7.0 12/25/2015 1935   GLUCOSEU NEGATIVE 12/25/2015 1935   HGBUR NEGATIVE 12/25/2015 1935   BILIRUBINUR NEGATIVE 12/25/2015 1935   KETONESUR NEGATIVE 12/25/2015 1935   PROTEINUR NEGATIVE 12/25/2015 1935   UROBILINOGEN 0.2 02/23/2013 1042   NITRITE NEGATIVE 12/25/2015 1935   LEUKOCYTESUR NEGATIVE 12/25/2015 1935   Sepsis Labs: @LABRCNTIP (procalcitonin:4,lacticidven:4) )No results found for this or any previous visit (from the past 240 hour(s)).    Radiological Exams on Admission: Dg Chest 2 View  Result Date: 06/29/2016 CLINICAL DATA:  Sudden onset chest pain starting approximately 1400 hours today. Central  chest pain and under left arm. Presented to emergency room yesterday after fall. History of hypertension. EXAM: CHEST  2 VIEW COMPARISON:  Chest x-ray dated 06/28/2016 and thoracic spine plain film dated 06/28/2016. FINDINGS: Heart size and mediastinal contours are normal. Atherosclerotic changes again noted at the aortic arch. Lungs remain clear. No evidence of pneumonia. No pleural effusion or pneumothorax seen. Mild degenerative change noted throughout the slightly scoliotic thoracic spine. No acute-appearing osseous abnormality. IMPRESSION: 1. No acute findings. 2. Aortic atherosclerosis. Electronically Signed   By: Bary RichardStan  Maynard M.D.   On: 06/29/2016 18:59   Dg Ribs Unilateral W/chest Right  Result Date: 06/28/2016 CLINICAL DATA:  Larey SeatFell from porch 3 days ago. Right anterior rib pain. EXAM: RIGHT RIBS AND CHEST - 3+ VIEW COMPARISON:  None. FINDINGS: Heart size is normal. Aortic atherosclerosis. Lungs are clear. No pneumothorax or hemothorax. Right rib films do not show a right-sided rib fracture. IMPRESSION: No active disease.  No rib fracture seen.  Aortic  atherosclerosis. Electronically Signed   By: Paulina FusiMark  Shogry M.D.   On: 06/28/2016 13:32   Dg Thoracic Spine W/swimmers  Result Date: 06/28/2016 CLINICAL DATA:  Larey SeatFell from porch 3 days ago. Back pain and right rib pain. EXAM: THORACIC SPINE - 3 VIEWS COMPARISON:  None. FINDINGS: Mild thoracic curvature convex to the right. No evidence of thoracic spine fracture. Aortic atherosclerosis incidentally noted. IMPRESSION: Negative. Aortic atherosclerosis. Electronically Signed   By: Paulina FusiMark  Shogry M.D.   On: 06/28/2016 13:31    EKG: Independently reviewed (RBBB)  Active Problems:   Chest pain   Assessment/Plan 1. Chest pain, cause unclear, possible cardiac versus PE versus musculoskeletal pain following a fall. 2. Elevated Troponin 3. CKD 3   Place patient on observation  CTA chest ordered by ER Physician  Cycle cardiac enzymes  [place] Lovenox (Full dose until PE is ruled out)  Cardiac stress test in am if CTA is negative for PE  Consider Cardiology consult in am  IVF as patient is at risk for contrast induced nephropathy  Pain control  Further management will depend on hospital course  DVT prophylaxis: On full anticoagulation Code Status: Full Family Communication: Daughter Disposition Plan: Home eventually   Consults called: None for none    Admission status: Observation    Time spent: Greater than 60 minutes  Berton MountSylvester Reeda Soohoo, MD  [facility] Pager #: [phone] 7PM-7AM contact night coverage as above   06/29/2016, 9:33 PM

## 2016-06-29 NOTE — ED Notes (Signed)
Pt back in room from xray 

## 2016-06-29 NOTE — ED Notes (Signed)
Pt provided drink/snack at this time. 

## 2016-06-29 NOTE — ED Triage Notes (Addendum)
Patient complaining of sudden onset of chest pain starting approximately 1400 today. States pain is in center of chest and under left arm. States patient was here yesterday for fall and was told she had a pulled muscle in her back.

## 2016-06-29 NOTE — ED Provider Notes (Signed)
[facility] Provider Note   CSN: [id number] Arrival date & time: 06/29/16  1745     History   Chief Complaint Chief Complaint  Patient presents with  . Chest Pain    HPI Haley Nguyen is a 82 y.o. female.  HPI   83yF with CP. Onset around 2pm today. "It hurts underneath my (left) titty." Pain has been persistent since then. Waxes and wanes. Paroxysms of sharper pain. In between she has a sensation that she needs to burp. Respiratory complaints. No fevers or chills. No unusual leg pain or swelling. She is having some back pain but she attributes this to a recent fall. Of note, she was recently seen the emergency room for this. She states that the chest pain is new though. Denies any past history of coronary artery disease. Per her daughter, she last had a stress test approximately a year ago w/o significant abnormalities.   Past Medical History:  Diagnosis Date  . Arthritis   . Hypertension   . RLQ abdominal pain 05/29/2013    Patient Active Problem List   Diagnosis Date Noted  . RLQ abdominal pain 05/29/2013    Past Surgical History:  Procedure Laterality Date  . ABDOMINAL HYSTERECTOMY    . LAPAROSCOPIC LYSIS OF ADHESIONS N/A 03/02/2013   Procedure: LAPAROSCOPIC LYSIS OF ADHESIONS;  Surgeon: Lazaro ArmsLuther H Eure, MD;  Location: [facility];  Service: Gynecology;  Laterality: N/A;  . LAPAROSCOPIC SALPINGO OOPHERECTOMY Bilateral 03/02/2013   Procedure: LAPAROSCOPIC SALPINGO OOPHORECTOMY;  Surgeon: Lazaro ArmsLuther H Eure, MD;  Location: [facility];  Service: Gynecology;  Laterality: Bilateral;    OB History    Gravida Para Term Preterm AB Living   1 1       1    SAB TAB Ectopic Multiple Live Births           1       Home Medications    Prior to Admission medications   Medication Sig Start Date End Date Taking? Authorizing Provider  acetaminophen (TYLENOL) 500 MG tablet Take 500 mg by mouth every 6 (six) hours as needed for mild pain or moderate pain.   Yes Historical Provider, MD   Alpha-D-Galactosidase Charlyne Quale(BEANO) TABS Take 1-2 tablets by mouth daily as needed (for flatulence).   Yes Historical Provider, MD  amLODipine (NORVASC) 10 MG tablet Take 10 mg by mouth every morning.    Yes Historical Provider, MD  cyclobenzaprine (FLEXERIL) 5 MG tablet Take 1 tablet (5 mg total) by mouth 2 (two) times daily as needed for muscle spasms. 06/28/16  Yes Pricilla LovelessScott Goldston, MD  diphenhydrAMINE (BENADRYL) 25 mg capsule Take 25 mg by mouth every 6 (six) hours as needed for allergies.   Yes Historical Provider, MD  docusate sodium (COLACE) 100 MG capsule Take 200 mg by mouth 2 (two) times daily.    Yes Historical Provider, MD  esomeprazole (NEXIUM) 20 MG capsule Take 20 mg by mouth daily at 12 noon.   Yes Historical Provider, MD  Garlic 1000 MG CAPS Take 1 capsule by mouth daily.   Yes Historical Provider, MD  Magnesium Oxide (PHILLIPS) 500 MG (LAX) TABS Take 1-2 tablets by mouth daily.   Yes Historical Provider, MD  metoprolol (LOPRESSOR) 50 MG tablet Take 50 mg by mouth 2 (two) times daily.   Yes Historical Provider, MD  Omega-3 Fatty Acids (FISH OIL) 1000 MG CAPS Take 1 capsule by mouth daily.   Yes Historical Provider, MD  polycarbophil (FIBERCON) 625 MG tablet Take 625-1,250 mg by  mouth daily.    Yes Historical Provider, MD  promethazine (PHENERGAN) 25 MG tablet Take 25 mg by mouth every 6 (six) hours as needed for nausea or vomiting.  12/24/15  Yes Historical Provider, MD  simvastatin (ZOCOR) 40 MG tablet Take 20 mg by mouth every evening.   Yes Historical Provider, MD  traMADol (ULTRAM) 50 MG tablet Take 50 mg by mouth every 6 (six) hours as needed for pain.   Yes Historical Provider, MD    Family History Family History  Problem Relation Age of Onset  . Heart disease Father   . Heart disease Mother     Social History Social History  Substance Use Topics  . Smoking status: Former Smoker    Years: 30.00    Types: Cigarettes  . Smokeless tobacco: Former NeurosurgeonUser    Types: Chew, Snuff    . Alcohol use 0.0 oz/week     Comment: occasional drink     Allergies   Review of patient's allergies indicates no known allergies.   Review of Systems Review of Systems  All systems reviewed and negative, other than as noted in HPI.   Physical Exam Updated Vital Signs BP 132/60   Pulse 68   Temp 98 F (36.7 C) (Oral)   Resp 17   SpO2 96%   Physical Exam  Constitutional: She appears well-developed and well-nourished. No distress.  HENT:  Head: Normocephalic and atraumatic.  Eyes: Conjunctivae are normal.  Neck: Neck supple.  Cardiovascular: Normal rate and regular rhythm.   No murmur heard. Pulmonary/Chest: Effort normal and breath sounds normal. No respiratory distress.  Tenderness to palpation lower sternum and left anterior chest wall. No concerning lesions noted.  Abdominal: Soft. There is no tenderness.  Musculoskeletal: She exhibits no edema.  Lower extremities symmetric as compared to each other. No calf tenderness. Negative Homan's. No palpable cords.   Neurological: She is alert.  Skin: Skin is warm and dry.  Psychiatric: She has a normal mood and affect.  Nursing note and vitals reviewed.    ED Treatments / Results  Labs (all labs ordered are listed, but only abnormal results are displayed) Labs Reviewed  BASIC METABOLIC PANEL - Abnormal; Notable for the following:       Result Value   Glucose, Bld 101 (*)    BUN 24 (*)    Creatinine, Ser 1.12 (*)    Calcium 8.5 (*)    GFR calc non Af Amer 45 (*)    GFR calc Af Amer 52 (*)    Anion gap 4 (*)    All other components within normal limits  TROPONIN I - Abnormal; Notable for the following:    Troponin I 0.06 (*)    All other components within normal limits  CBC    EKG  EKG Interpretation  Date/Time:  Monday June 29 2016 18:09:34 EDT Ventricular Rate:  68 PR Interval:    QRS Duration: 145 QT Interval:  437 QTC Calculation: 465 R Axis:   150 Text Interpretation:  Sinus rhythm Right  bundle branch block No significant change since last tracing Reconfirmed by Juleen ChinaKOHUT  MD, Duaine Radin ([id number]) on 06/29/2016 8:16:56 PM       Radiology Dg Chest 2 View  Result Date: 06/29/2016 CLINICAL DATA:  Sudden onset chest pain starting approximately 1400 hours today. Central chest pain and under left arm. Presented to emergency room yesterday after fall. History of hypertension. EXAM: CHEST  2 VIEW COMPARISON:  Chest x-ray dated 06/28/2016 and thoracic spine  plain film dated 06/28/2016. FINDINGS: Heart size and mediastinal contours are normal. Atherosclerotic changes again noted at the aortic arch. Lungs remain clear. No evidence of pneumonia. No pleural effusion or pneumothorax seen. Mild degenerative change noted throughout the slightly scoliotic thoracic spine. No acute-appearing osseous abnormality. IMPRESSION: 1. No acute findings. 2. Aortic atherosclerosis. Electronically Signed   By: Bary Richard M.D.   On: 06/29/2016 18:59   Dg Ribs Unilateral W/chest Right  Result Date: 06/28/2016 CLINICAL DATA:  Larey Seat from porch 3 days ago. Right anterior rib pain. EXAM: RIGHT RIBS AND CHEST - 3+ VIEW COMPARISON:  None. FINDINGS: Heart size is normal. Aortic atherosclerosis. Lungs are clear. No pneumothorax or hemothorax. Right rib films do not show a right-sided rib fracture. IMPRESSION: No active disease.  No rib fracture seen.  Aortic atherosclerosis. Electronically Signed   By: Paulina Fusi M.D.   On: 06/28/2016 13:32   Dg Thoracic Spine W/swimmers  Result Date: 06/28/2016 CLINICAL DATA:  Larey Seat from porch 3 days ago. Back pain and right rib pain. EXAM: THORACIC SPINE - 3 VIEWS COMPARISON:  None. FINDINGS: Mild thoracic curvature convex to the right. No evidence of thoracic spine fracture. Aortic atherosclerosis incidentally noted. IMPRESSION: Negative. Aortic atherosclerosis. Electronically Signed   By: Paulina Fusi M.D.   On: 06/28/2016 13:31    Procedures Procedures (including critical care  time)  Medications Ordered in ED Medications  ketorolac (TORADOL) 30 MG/ML injection 15 mg (15 mg Intravenous Given 06/29/16 1947)  HYDROmorphone (DILAUDID) injection 0.5 mg (0.5 mg Intravenous Given 06/29/16 1951)  LORazepam (ATIVAN) injection 0.5 mg (0.5 mg Intravenous Given 06/29/16 1953)  aspirin chewable tablet 324 mg (324 mg Oral Given 06/29/16 2017)     Initial Impression / Assessment and Plan / ED Course  I have reviewed the triage vital signs and the nursing notes.  Pertinent labs & imaging results that were available during my care of the patient were reviewed by me and considered in my medical decision making (see chart for details).  Clinical Course    82 year old female with chest pain. Sensory atypical for ACS. Her EKG is abnormal, but not simply change from prior. Her troponin is mildly elevated though. Her symptoms have now essentially resolved after pain medicines. She was given aspirin. She is having some back pain with it, but this is most likely from her recent fall. It preceding the CP. I doubt dissection or PE. D-dimer added at request of admitting physician. We'll admit to trend enzymes.  Final Clinical Impressions(s) / ED Diagnoses   Final diagnoses:  Chest pain, unspecified chest pain type    New Prescriptions New Prescriptions   No medications on file     Raeford Razor, MD 07/04/16 2131

## 2016-06-29 NOTE — ED Notes (Signed)
CRITICAL VALUE ALERT  Critical value received:  Troponin 0.06  Date of notification:  06/29/2016  Time of notification:  1950  Critical value read back:Yes.    Nurse who received alert:  Neldon Mcina Kentrel Clevenger RN  MD notified (1st page):  Dr. Juleen ChinaKohut  Time of first page:  1951  MD notified (2nd page):  Time of second page:  Responding MD:    Time MD responded:  [phone]

## 2016-06-30 ENCOUNTER — Observation Stay (HOSPITAL_BASED_OUTPATIENT_CLINIC_OR_DEPARTMENT_OTHER): Payer: Medicare PPO

## 2016-06-30 ENCOUNTER — Encounter (HOSPITAL_COMMUNITY): Payer: Self-pay | Admitting: Cardiology

## 2016-06-30 DIAGNOSIS — R0789 Other chest pain: Secondary | ICD-10-CM

## 2016-06-30 DIAGNOSIS — R071 Chest pain on breathing: Secondary | ICD-10-CM

## 2016-06-30 DIAGNOSIS — R7989 Other specified abnormal findings of blood chemistry: Secondary | ICD-10-CM | POA: Diagnosis not present

## 2016-06-30 DIAGNOSIS — R296 Repeated falls: Secondary | ICD-10-CM

## 2016-06-30 DIAGNOSIS — W108XXA Fall (on) (from) other stairs and steps, initial encounter: Secondary | ICD-10-CM | POA: Diagnosis not present

## 2016-06-30 DIAGNOSIS — R079 Chest pain, unspecified: Secondary | ICD-10-CM

## 2016-06-30 DIAGNOSIS — I10 Essential (primary) hypertension: Secondary | ICD-10-CM | POA: Diagnosis not present

## 2016-06-30 LAB — ECHOCARDIOGRAM COMPLETE
CHL CUP DOP CALC LVOT VTI: 21.3 cm
E decel time: 246 msec
EERAT: 13.44
FS: 43 % (ref 28–44)
Height: 67 in
IVS/LV PW RATIO, ED: 1.07
LA ID, A-P, ES: 30 mm
LA vol A4C: 32.5 ml
LA vol index: 15.8 mL/m2
LADIAMINDEX: 1.57 cm/m2
LAVOL: 30.3 mL
LEFT ATRIUM END SYS DIAM: 30 mm
LV TDI E'LATERAL: 6.42
LV dias vol index: 21 mL/m2
LV dias vol: 40 mL — AB (ref 46–106)
LV sys vol index: 7 mL/m2
LV sys vol: 14 mL (ref 14–42)
LVEEAVG: 13.44
LVEEMED: 13.44
LVELAT: 6.42 cm/s
LVOT area: 3.14 cm2
LVOT diameter: 20 mm
LVOT peak grad rest: 4 mmHg
LVOTPV: 106 cm/s
LVOTSV: 67 mL
MV Dec: 246
MV pk A vel: 115 m/s
MVPG: 3 mmHg
MVPKEVEL: 86.3 m/s
PW: 10.7 mm — AB (ref 0.6–1.1)
RV LATERAL S' VELOCITY: 11.2 cm/s
RV TAPSE: 22 mm
Simpson's disk: 66
Stroke v: 26 ml
TDI e' medial: 5.44
Weight: 2687.85 oz

## 2016-06-30 LAB — RENAL FUNCTION PANEL
Albumin: 3.5 g/dL (ref 3.5–5.0)
Anion gap: 5 (ref 5–15)
BUN: 25 mg/dL — ABNORMAL HIGH (ref 6–20)
CO2: 27 mmol/L (ref 22–32)
Calcium: 8.5 mg/dL — ABNORMAL LOW (ref 8.9–10.3)
Chloride: 107 mmol/L (ref 101–111)
Creatinine, Ser: 0.99 mg/dL (ref 0.44–1.00)
GFR calc Af Amer: >60 mL/min (ref >60)
GFR calc non Af Amer: 52 mL/min — ABNORMAL LOW (ref >60)
Glucose, Bld: 106 mg/dL — ABNORMAL HIGH (ref 65–99)
Phosphorus: 4.6 mg/dL (ref 2.5–4.6)
Potassium: 3.9 mmol/L (ref 3.5–5.1)
Sodium: 139 mmol/L (ref 135–145)

## 2016-06-30 LAB — TROPONIN I
Troponin I: 0.05 ng/mL (ref <0.03)
Troponin I: 0.05 ng/mL (ref <0.03)

## 2016-06-30 MED ORDER — ENOXAPARIN SODIUM 40 MG/0.4ML ~~LOC~~ SOLN: 40.0000 mg | SUBCUTANEOUS | Status: DC

## 2016-06-30 MED ORDER — KETOROLAC TROMETHAMINE 30 MG/ML IJ SOLN
30.0000 mg | Freq: Four times a day (QID) | INTRAMUSCULAR | Status: DC | PRN
  Administered 2016-06-30: 30 mg via INTRAVENOUS
  Filled 2016-06-30: qty 1

## 2016-06-30 MED ORDER — KETOROLAC TROMETHAMINE 10 MG PO TABS: 10.0000 mg | ORAL_TABLET | Freq: Four times a day (QID) | ORAL | 0 refills | Status: DC | PRN

## 2016-06-30 NOTE — Progress Notes (Signed)
Discharge instructions and prescriptions given, verbalized understanding, out in stable condition via w/c with staff. 

## 2016-06-30 NOTE — Progress Notes (Signed)
*  PRELIMINARY RESULTS* Echocardiogram 2D Echocardiogram has been performed.  Stacey DrainWhite, Tenzin Pavon J 06/30/2016, 3:10 PM

## 2016-06-30 NOTE — Discharge Summary (Signed)
Physician Discharge Summary  Haley Nguyen ZOX:[id number] DOB: August 20, 1933 DOA: 06/29/2016  PCP: Smith RobertStephen Kikel, MD  Admit date: 06/29/2016 Discharge date: 06/30/2016  Admitted From: Home Disposition:  Home  Recommendations for Outpatient Follow-up:  1. Follow up with PCP in 2-3 weeks  Discharge Condition:Stable CODE STATUS:Full Diet recommendation: Heart healthy   Brief/Interim Summary: 82 year old female with history of HTN and arthritis. Patient is said to have been falling lately. Patient presents with left sided chest pain with no associated nausea or diaphoresis. Patient reports associated pain on deep breathing. Patient is said to have fallen at home about 2 days ago prior to admission, reportedly missed a step while going downs stairs.. No headache, no neck pain, no GI symptoms or urinary symptom. Patient was admitted for further workup.  Chest pain -Troponin very mildly elevated. -Cardiology was consulted. Recommendations for normal further ischemic workup at this time. Instead, recommendation for 2-D echocardiogram -Patient underwent 2-D echocardiogram on 06/28/2016 with findings of EF of 60-65% and normal wall motion with no regional wall motion abnormalities. -Patient's chest pain was very reproducible on exam. Suspect chest wall pain status post recent mechanical fall -We'll provide prescription for ketorolac on discharge -Of note, patient did undergo a CT of the chest. No evidence for PE  Falls -Seen by physical therapy, recommendations for outpatient PT  Hypertension -Blood pressure stable and controlled  Discharge Diagnoses:  Active Problems:   Chest pain   Fall (on) (from) other stairs and steps, initial encounter    Discharge Instructions     Medication List    TAKE these medications   acetaminophen 500 MG tablet Commonly known as:  TYLENOL Take 500 mg by mouth every 6 (six) hours as needed for mild pain or moderate pain.   amLODipine 10 MG  tablet Commonly known as:  NORVASC Take 10 mg by mouth every morning.   BEANO Tabs Take 1-2 tablets by mouth daily as needed (for flatulence).   cyclobenzaprine 5 MG tablet Commonly known as:  FLEXERIL Take 1 tablet (5 mg total) by mouth 2 (two) times daily as needed for muscle spasms.   diphenhydrAMINE 25 mg capsule Commonly known as:  BENADRYL Take 25 mg by mouth every 6 (six) hours as needed for allergies.   docusate sodium 100 MG capsule Commonly known as:  COLACE Take 200 mg by mouth 2 (two) times daily.   esomeprazole 20 MG capsule Commonly known as:  NEXIUM Take 20 mg by mouth daily at 12 noon.   Fish Oil 1000 MG Caps Take 1 capsule by mouth daily.   Garlic 1000 MG Caps Take 1 capsule by mouth daily.   ketorolac 10 MG tablet Commonly known as:  TORADOL Take 1 tablet (10 mg total) by mouth every 6 (six) hours as needed.   metoprolol 50 MG tablet Commonly known as:  LOPRESSOR Take 50 mg by mouth 2 (two) times daily.   PHILLIPS 500 MG (LAX) Tabs Generic drug:  Magnesium Oxide Take 1-2 tablets by mouth daily.   polycarbophil 625 MG tablet Commonly known as:  FIBERCON Take 625-1,250 mg by mouth daily.   promethazine 25 MG tablet Commonly known as:  PHENERGAN Take 25 mg by mouth every 6 (six) hours as needed for nausea or vomiting.   simvastatin 40 MG tablet Commonly known as:  ZOCOR Take 20 mg by mouth every evening.   traMADol 50 MG tablet Commonly known as:  ULTRAM Take 50 mg by mouth every 6 (six) hours as needed  for pain.      Follow-up Information    Smith RobertStephen Kikel, MD Follow up in 2 week(s).   Specialty:  Family Medicine Contact information: [address] [place] [address] [phone]          No Known Allergies  Consultations:  Cardiology  Procedures/Studies: Dg Chest 2 View  Result Date: 06/29/2016 CLINICAL DATA:  Sudden onset chest pain starting approximately 1400 hours today. Central chest pain and under left arm.  Presented to emergency room yesterday after fall. History of hypertension. EXAM: CHEST  2 VIEW COMPARISON:  Chest x-ray dated 06/28/2016 and thoracic spine plain film dated 06/28/2016. FINDINGS: Heart size and mediastinal contours are normal. Atherosclerotic changes again noted at the aortic arch. Lungs remain clear. No evidence of pneumonia. No pleural effusion or pneumothorax seen. Mild degenerative change noted throughout the slightly scoliotic thoracic spine. No acute-appearing osseous abnormality. IMPRESSION: 1. No acute findings. 2. Aortic atherosclerosis. Electronically Signed   By: Bary RichardStan  Maynard M.D.   On: 06/29/2016 18:59   Dg Ribs Unilateral W/chest Right  Result Date: 06/28/2016 CLINICAL DATA:  Larey SeatFell from porch 3 days ago. Right anterior rib pain. EXAM: RIGHT RIBS AND CHEST - 3+ VIEW COMPARISON:  None. FINDINGS: Heart size is normal. Aortic atherosclerosis. Lungs are clear. No pneumothorax or hemothorax. Right rib films do not show a right-sided rib fracture. IMPRESSION: No active disease.  No rib fracture seen.  Aortic atherosclerosis. Electronically Signed   By: Paulina FusiMark  Shogry M.D.   On: 06/28/2016 13:32   Dg Thoracic Spine W/swimmers  Result Date: 06/28/2016 CLINICAL DATA:  Larey SeatFell from porch 3 days ago. Back pain and right rib pain. EXAM: THORACIC SPINE - 3 VIEWS COMPARISON:  None. FINDINGS: Mild thoracic curvature convex to the right. No evidence of thoracic spine fracture. Aortic atherosclerosis incidentally noted. IMPRESSION: Negative. Aortic atherosclerosis. Electronically Signed   By: Paulina FusiMark  Shogry M.D.   On: 06/28/2016 13:31   Ct Angio Chest Pe W And/or Wo Contrast  Result Date: 06/29/2016 CLINICAL DATA:  Right-sided back pain x2 days, elevated D-dimer EXAM: CT ANGIOGRAPHY CHEST WITH CONTRAST TECHNIQUE: Multidetector CT imaging of the chest was performed using the standard protocol during bolus administration of intravenous contrast. Multiplanar CT image reconstructions and MIPs were  obtained to evaluate the vascular anatomy. CONTRAST:  100 mL Isovue 370 IV COMPARISON:  Chest radiographs dated 06/29/2016 FINDINGS: Cardiovascular: Satisfactory evaluation of the pulmonary arteries to the segmental level. Evaluation of the bilateral lower lobe pulmonary arteries is constrained by respiratory motion. No evidence of pulmonary embolism. No evidence of thoracic aortic aneurysm. Atherosclerotic calcifications of the aortic arch. The heart is normal in size.  No pericardial effusion. Coronary atherosclerosis in the LAD and right coronary artery. Mediastinum/Nodes: No suspicious mediastinal, hilar, or axillary lymphadenopathy. Visualized thyroid is unremarkable. Lungs/Pleura: Evaluation of the lung parenchyma is constrained by respiratory motion. No suspicious pulmonary nodules. No focal consolidation. Mild dependent atelectasis in the bilateral lower lobes. No pleural effusion or pneumothorax. Upper Abdomen: Visualized upper abdomen is notable for a 1.6 cm cyst along the anterior left upper kidney (series 4/ image 134), unchanged from prior CT abdomen/ pelvis. Musculoskeletal: Degenerative changes of the lower thoracic spine. Review of the MIP images confirms the above findings. IMPRESSION: No evidence of pulmonary embolism. No evidence of acute cardiopulmonary disease. Electronically Signed   By: Charline BillsSriyesh  Krishnan M.D.   On: 06/29/2016 22:49    Subjective: Complains of chest wall pain on deep inspiration  Discharge Exam: Vitals:  06/30/16 0558 06/30/16 1300  BP: 123/89 111/72  Pulse: 65 61  Resp: 20 20  Temp: 99.5 F (37.5 C) 98 F (36.7 C)   Vitals:   06/29/16 2251 06/30/16 0100 06/30/16 0558 06/30/16 1300  BP: (!) 173/69  123/89 111/72  Pulse: 65  65 61  Resp:   20 20  Temp: 97.8 F (36.6 C)  99.5 F (37.5 C) 98 F (36.7 C)  TempSrc: Oral  Oral Oral  SpO2: 96%  95% 96%  Weight:  76.2 kg (167 lb 15.9 oz)    Height:  5\' 7"  (1.702 m)      General: Pt is alert, awake, not  in acute distress Cardiovascular: RRR, S1/S2 +, no rubs, no gallops Respiratory: CTA bilaterally, no wheezing, no rhonchi Abdominal: Soft, NT, ND, bowel sounds + Extremities: no edema, no cyanosis   The results of significant diagnostics from this hospitalization (including imaging, microbiology, ancillary and laboratory) are listed below for reference.     Microbiology: No results found for this or any previous visit (from the past 240 hour(s)).   Labs: BNP (last 3 results) No results for input(s): BNP in the last 8760 hours. Basic Metabolic Panel:  Recent Labs Lab 06/29/16 1816 06/29/16 2304 06/30/16 0543  NA 138  --  139  K 3.6  --  3.9  CL 107  --  107  CO2 27  --  27  GLUCOSE 101*  --  106*  BUN 24*  --  25*  CREATININE 1.12*  --  0.99  CALCIUM 8.5*  --  8.5*  MG  --  2.4  --   PHOS  --   --  4.6   Liver Function Tests:  Recent Labs Lab 06/30/16 0543  ALBUMIN 3.5   No results for input(s): LIPASE, AMYLASE in the last 168 hours. No results for input(s): AMMONIA in the last 168 hours. CBC:  Recent Labs Lab 06/29/16 1816  WBC 6.4  HGB 14.0  HCT 42.4  MCV 88.7  PLT 273   Cardiac Enzymes:  Recent Labs Lab 06/29/16 1816 06/29/16 2304 06/30/16 0202 06/30/16 0543  TROPONINI 0.06* 0.06* 0.05* 0.05*   BNP: Invalid input(s): POCBNP CBG: No results for input(s): GLUCAP in the last 168 hours. D-Dimer  Recent Labs  06/29/16 1816  DDIMER 0.54*   Hgb A1c No results for input(s): HGBA1C in the last 72 hours. Lipid Profile No results for input(s): CHOL, HDL, LDLCALC, TRIG, CHOLHDL, LDLDIRECT in the last 72 hours. Thyroid function studies No results for input(s): TSH, T4TOTAL, T3FREE, THYROIDAB in the last 72 hours.  Invalid input(s): FREET3 Anemia work up No results for input(s): VITAMINB12, FOLATE, FERRITIN, TIBC, IRON, RETICCTPCT in the last 72 hours. Urinalysis    Component Value Date/Time   COLORURINE YELLOW 12/25/2015 1935    APPEARANCEUR HAZY (A) 12/25/2015 1935   LABSPEC 1.010 12/25/2015 1935   PHURINE 7.0 12/25/2015 1935   GLUCOSEU NEGATIVE 12/25/2015 1935   HGBUR NEGATIVE 12/25/2015 1935   BILIRUBINUR NEGATIVE 12/25/2015 1935   KETONESUR NEGATIVE 12/25/2015 1935   PROTEINUR NEGATIVE 12/25/2015 1935   UROBILINOGEN 0.2 02/23/2013 1042   NITRITE NEGATIVE 12/25/2015 1935   LEUKOCYTESUR NEGATIVE 12/25/2015 1935   Sepsis Labs Invalid input(s): PROCALCITONIN,  WBC,  LACTICIDVEN Microbiology No results found for this or any previous visit (from the past 240 hour(s)).  SIGNED:  Jerald Kief, MD  [facility] 06/30/2016, 5:00 PM  If 7PM-7AM, please contact night-coverage [email or web address] Password [email or web address]

## 2016-06-30 NOTE — Progress Notes (Signed)
    Please see echocardiogram report. Overall normal LVEF without focal wall motion abnormalities. Do not anticipate further cardiac workup at this time. We will sign off.  Jonelle SidleSamuel G. Jerrick Farve, M.D., F.A.C.C.

## 2016-06-30 NOTE — Evaluation (Signed)
Physical Therapy Evaluation Patient Details Name: Haley Nguyen MRN: [id number] DOB: Oct 02, 1933 Today's Date: 06/30/2016   History of Present Illness  82 y.o.female admitted to the hospital with chest discomfort and reportedly a recent fall. She states that she lives alone in her home in [place], had a fall a few days ago, details are not clear. She gives somewhat of a limited history but it seems that she has relatively frequent falls. She states that she was originally having some discomfort on her right side when she moved her right arm, then became more pleuritic left lower costal discomfort in the last few days. She states this is much worse and she takes a deep breath, also has soreness to palpation of the left lower anterior chest.She was admitted for further evaluation by the hospitalist team. Cardiac markers have been equivocal with troponin I levels and flat pattern from 0.05-0.06, not clearly diagnostic of ACS. ECG shows right bundle branch block with nonspecific ST-T changes. She had a chest CT angiogram that was negative for pulmonary embolus. Incidentally noted were calcifications of the aorta and coronary arteries.  PMH: arthritis, HTn, RLQ abdominal pain s/p laparoscopic lysis of adhesions 03/02/2013.    Clinical Impression  Pt received sitting up in the chair, granddaughter present, and pt is agreeable to PT evaluation.  Pt expressed that she did not see the end of the porch, and that is why she stepped off the edge.  Granddaughter expressed that the pain in her L chest started prior to her fall.  Granddaughter also stated that she had recently started going to a new neurologist in [place], who has been giving her injections for her back pain - however granddaugther is concern that it was not give in the place where she is actually having pain.    Today during PT evaluation, she demonstrates impulsivity and poor safety awareness, which the granddaughter states is nothing new.   Pt also scored a 44/56 on the BERG Balance Test, which indicates moderate risk of falling.  Encouraged pt to use her walking stick when she is on uneven terrain, and educated on my recommendations for OPPT for balance and strengthening.  Granddaughter stated that she would be able to provide transportation to and from [place] if needed.  No further skilled acute PT needed at this time, will sign off.    Follow Up Recommendations Outpatient PT    Equipment Recommendations  None recommended by PT    Recommendations for Other Services       Precautions / Restrictions Precautions Precautions: Fall Precaution Comments: Pt has had 2 falls in the past 6 months.  Pt states that she thought there was more porch, but ended up stepping off the edge of it.  Restrictions Weight Bearing Restrictions: No      Mobility  Bed Mobility Overal bed mobility:  (NT - pt already sitting up in the chair upon arrival. )                Transfers Overall transfer level: Modified independent (Pt jumps up from chair quickly, prior to PT getting things in the room set up to mobilize safely. ) Equipment used: None                Ambulation/Gait Ambulation/Gait assistance: Min guard Ambulation Distance (Feet): 300 Feet Assistive device: None Gait Pattern/deviations: Step-through pattern;Drifts right/left     General Gait Details: Pt demonstrates some veering from path during gait with marked impulsivity (ie: doing straight leg kicks  while walking which caused her to need to reach for the wall rail due to LOB)  Stairs Stairs: Yes Stairs assistance: Supervision Stair Management: One rail Left;Alternating pattern;Forwards Number of Stairs: 4 General stair comments: Pt continued to demonstrate impulsivity and poor safety awareness as she performed straight leg kicks while coming down the steps.  Pt education to focus on safety.   Wheelchair Mobility    Modified Rankin (Stroke Patients Only)        Balance                                 Standardized Balance Assessment Standardized Balance Assessment : Berg Balance Test Berg Balance Test Sit to Stand: Able to stand without using hands and stabilize independently Standing Unsupported: Able to stand safely 2 minutes Sitting with Back Unsupported but Feet Supported on Floor or Stool: Able to sit safely and securely 2 minutes Stand to Sit: Sits safely with minimal use of hands Transfers: Able to transfer safely, minor use of hands Standing Unsupported with Eyes Closed: Able to stand 10 seconds with supervision Standing Ubsupported with Feet Together: Able to place feet together independently and stand for 1 minute with supervision From Standing, Reach Forward with Outstretched Arm: Can reach forward >12 cm safely (5") From Standing Position, Pick up Object from Floor: Able to pick up shoe safely and easily From Standing Position, Turn to Look Behind Over each Shoulder: Looks behind from both sides and weight shifts well Turn 360 Degrees: Able to turn 360 degrees safely one side only in 4 seconds or less Standing Unsupported, Alternately Place Feet on Step/Stool: Able to complete >2 steps/needs minimal assist (completed 8 steps, but required Min A due to LOB with impulsivity. ) Standing Unsupported, One Foot in Front: Needs help to step but can hold 15 seconds Standing on One Leg: Able to lift leg independently and hold equal to or more than 3 seconds Total Score: 44         Pertinent Vitals/Pain Pain Assessment: 0-10 Pain Score: 6  Pain Location: lower back Pain Descriptors / Indicators: Cramping Pain Intervention(s): Limited activity within patient's tolerance;Monitored during session    Home Living   Living Arrangements: Alone (Dtr lives next door.  ) Available Help at Discharge: Family Type of Home: House Home Access: Ramped entrance   Entrance Stairs-Number of Steps: none Home Layout: Two level;Able  to live on main level with bedroom/bathroom Home Equipment: Grab bars - tub/shower;Bedside commode (Pt sleeps on the couch, walking stick. )      Prior Function     Gait / Transfers Assistance Needed: independent.   ADL's / Homemaking Assistance Needed: independent.         Hand Dominance   Dominant Hand: Right    Extremity/Trunk Assessment   Upper Extremity Assessment: Overall WFL for tasks assessed           Lower Extremity Assessment: Overall WFL for tasks assessed         Communication   Communication: No difficulties  Cognition Arousal/Alertness: Awake/alert Behavior During Therapy: WFL for tasks assessed/performed Overall Cognitive Status: Within Functional Limits for tasks assessed                      General Comments      Exercises        Assessment/Plan    PT Assessment All further PT needs can be met in  the next venue of care  PT Diagnosis Abnormality of gait   PT Problem List Decreased balance;Decreased safety awareness;Decreased strength  PT Treatment Interventions     PT Goals (Current goals can be found in the Care Plan section) Acute Rehab PT Goals PT Goal Formulation: All assessment and education complete, DC therapy    Frequency     Barriers to discharge        Co-evaluation               End of Session Equipment Utilized During Treatment: Gait belt Activity Tolerance: Patient tolerated treatment well Patient left: in chair;with family/visitor present Nurse Communication: Mobility status    Functional Assessment Tool Used: [employer] "6-clicks"  Functional Limitation: Mobility: Walking and moving around Mobility: Walking and Moving Around Current Status [phone]): At least 20 percent but less than 40 percent impaired, limited or restricted Mobility: Walking and Moving Around Goal Status [phone]): At least 20 percent but less than 40 percent impaired, limited or restricted Mobility: Walking and Moving  Around Discharge Status [phone]): At least 20 percent but less than 40 percent impaired, limited or restricted    Time: 1310-1335 PT Time Calculation (min) (ACUTE ONLY): 25 min   Charges:   PT Evaluation $PT Eval Moderate Complexity: 1 Procedure PT Treatments $Neuromuscular Re-education: 8-22 mins   PT G Codes:   PT G-Codes **NOT FOR INPATIENT CLASS** Functional Assessment Tool Used: [employer] "6-clicks"  Functional Limitation: Mobility: Walking and moving around Mobility: Walking and Moving Around Current Status [phone]): At least 20 percent but less than 40 percent impaired, limited or restricted Mobility: Walking and Moving Around Goal Status [phone]): At least 20 percent but less than 40 percent impaired, limited or restricted Mobility: Walking and Moving Around Discharge Status [phone]): At least 20 percent but less than 40 percent impaired, limited or restricted   Beth Mireyah Chervenak, PT, DPT X: [phone]

## 2016-06-30 NOTE — Care Management Obs Status (Signed)
MEDICARE OBSERVATION STATUS NOTIFICATION   Patient Details  Name: Haley Nguyen MRN: [id number] Date of Birth: [birth year]   Medicare Observation Status Notification Given:  Yes    Malcolm MetroChildress, Leonard Feigel Demske, RN 06/30/2016, 1:53 PM

## 2016-06-30 NOTE — Care Management Note (Signed)
Case Management Note  Patient Details  Name: Haley MathRuth L Barlowe MRN: [id number] Date of Birth: 07/23/1933  Subjective/Objective:                  Pt is from home, lives alone and has strong family support. She has a PCP, has family that provides transportation and has no difficulty affording her medications. She plans to return home with self care. She uses cane for mobility. She plans to return home with self care. PT has recommended OP PT. Pt is agreeable and would [place] [facility] [place]. Referral received from MD and faxed to OP facility. Anticipate DC home today.   Action/Plan: No further needs.   Expected Discharge Date:     06/30/2016             Expected Discharge Plan:  Home/Self Care  In-House Referral:  NA  Discharge planning Services  CM Consult  Post Acute Care Choice:  NA Choice offered to:  NA  DME Arranged:    DME Agency:     HH Arranged:    HH Agency:     Status of Service:  Completed, signed off  If discussed at [employer] Length of Stay Meetings, dates discussed:    Additional Comments:  Malcolm MetroChildress, Libbie Bartley Demske, RN 06/30/2016, 1:53 PM

## 2016-06-30 NOTE — Consult Note (Signed)
Requesting provider: Dr. Rickey Barbara Consulting cardiologist: Dr. Jonelle Sidle  Reason for consultation: Chest pain  Clinical Summary Haley Nguyen is an 82 y.o.female admitted to the hospital with chest discomfort and reportedly a recent fall. She states that she lives alone in her home in [place], had a fall a few days ago, details are not clear. She gives somewhat of a limited history but it seems that she has relatively frequent falls. She states that she was originally having some discomfort on her right side when she moved her right arm, then became more pleuritic left lower costal discomfort in the last few days. She states this is much worse and she takes a deep breath, also has soreness to palpation of the left lower anterior chest.  She was admitted for further evaluation by the hospitalist team. Cardiac markers have been equivocal with troponin I levels and flat pattern from 0.05-0.06, not clearly diagnostic of ACS. ECG shows right bundle branch block with nonspecific ST-T changes. She had a chest CT angiogram that was negative for pulmonary embolus. Incidentally noted were calcifications of the aorta and coronary arteries.  As an outpatient she is on Norvasc, Lopressor, and Zocor.  No Known Allergies  Medications Scheduled Medications: . amLODipine  10 mg Oral Daily  . aspirin  325 mg Oral Daily  . docusate sodium  200 mg Oral BID  . enoxaparin (LOVENOX) injection  40 mg Subcutaneous Q24H  . magnesium oxide  800 mg Oral Daily  . metoprolol  50 mg Oral BID  . omega-3 acid ethyl esters  1 g Oral Daily  . pantoprazole  40 mg Oral Daily  . polycarbophil  625-1,250 mg Oral Daily  . simvastatin  20 mg Oral QPM    Infusions: . sodium chloride 75 mL/hr at 06/29/16 2338    PRN Medications: acetaminophen, cyclobenzaprine, HYDROcodone-acetaminophen, ondansetron (ZOFRAN) IV   Past Medical History:  Diagnosis Date  . Arthritis   . Essential hypertension   .  RLQ abdominal pain 05/29/2013    Past Surgical History:  Procedure Laterality Date  . ABDOMINAL HYSTERECTOMY    . LAPAROSCOPIC LYSIS OF ADHESIONS N/A 03/02/2013   Procedure: LAPAROSCOPIC LYSIS OF ADHESIONS;  Surgeon: Lazaro Arms, MD;  Location: [facility];  Service: Gynecology;  Laterality: N/A;  . LAPAROSCOPIC SALPINGO OOPHERECTOMY Bilateral 03/02/2013   Procedure: LAPAROSCOPIC SALPINGO OOPHORECTOMY;  Surgeon: Lazaro Arms, MD;  Location: [facility];  Service: Gynecology;  Laterality: Bilateral;    Family History  Problem Relation Age of Onset  . Heart disease Father   . Heart disease Mother     Social History Haley Nguyen reports that she has quit smoking. Her smoking use included Cigarettes. She quit after 30.00 years of use. She has quit using smokeless tobacco. Her smokeless tobacco use included Chew and Snuff. Ms. Tiedt reports that she drinks alcohol.  Review of Systems Complete review of systems negative except as otherwise outlined in the clinical summary and also the following. Decreased hearing.  Physical Examination Blood pressure 123/89, pulse 65, temperature 99.5 F (37.5 C), temperature source Oral, resp. rate 20, height 5\' 7"  (1.702 m), weight 167 lb 15.9 oz (76.2 kg), SpO2 95 %.  Intake/Output Summary (Last 24 hours) at 06/30/16 1001 Last data filed at 06/30/16 0600  Gross per 24 hour  Intake            477.5 ml  Output  0 ml  Net            477.5 ml   Telemetry: Sinus rhythm.  Gen.: Elderly woman, no distress. HEENT: Conjunctiva and lids normal, oropharynx clear with poor dentition. Neck: Supple, no elevated JVP or carotid bruits, no thyromegaly. Lungs: Clear to auscultation, nonlabored breathing at rest. Thorax: Tenderness to palpation of the left lower costal margin. Cardiac: Regular rate and rhythm, no S3, soft systolic murmur, no pericardial rub. Abdomen: Soft, nontender, bowel sounds present, no guarding or rebound. Extremities: Trace  ankle edema, distal pulses 2+. Skin: Warm and dry. Musculoskeletal: Mild kyphosis. Neuropsychiatric: Alert and oriented x3, affect grossly appropriate. Hard of hearing.  Lab Results  Basic Metabolic Panel:  Recent Labs Lab 06/29/16 1816 06/29/16 2304 06/30/16 0543  NA 138  --  139  K 3.6  --  3.9  CL 107  --  107  CO2 27  --  27  GLUCOSE 101*  --  106*  BUN 24*  --  25*  CREATININE 1.12*  --  0.99  CALCIUM 8.5*  --  8.5*  MG  --  2.4  --   PHOS  --   --  4.6    Liver Function Tests:  Recent Labs Lab 06/30/16 0543  ALBUMIN 3.5    CBC:  Recent Labs Lab 06/29/16 1816  WBC 6.4  HGB 14.0  HCT 42.4  MCV 88.7  PLT 273    Cardiac Enzymes:  Recent Labs Lab 06/29/16 1816 06/29/16 2304 06/30/16 0202 06/30/16 0543  TROPONINI 0.06* 0.06* 0.05* 0.05*    ECG Tracing from 06/30/2016 shows sinus rhythm with right bundle branch block and nonspecific ST-T abnormalities.  Imaging  Chest CTA 06/29/2016: FINDINGS: Cardiovascular: Satisfactory evaluation of the pulmonary arteries to the segmental level. Evaluation of the bilateral lower lobe pulmonary arteries is constrained by respiratory motion.  No evidence of pulmonary embolism.  No evidence of thoracic aortic aneurysm. Atherosclerotic calcifications of the aortic arch.  The heart is normal in size.  No pericardial effusion.  Coronary atherosclerosis in the LAD and right coronary artery.  Mediastinum/Nodes: No suspicious mediastinal, hilar, or axillary lymphadenopathy.  Visualized thyroid is unremarkable.  Lungs/Pleura: Evaluation of the lung parenchyma is constrained by respiratory motion.  No suspicious pulmonary nodules.  No focal consolidation.  Mild dependent atelectasis in the bilateral lower lobes.  No pleural effusion or pneumothorax.  Upper Abdomen: Visualized upper abdomen is notable for a 1.6 cm cyst along the anterior left upper kidney (series 4/ image 134), unchanged  from prior CT abdomen/ pelvis.  Musculoskeletal: Degenerative changes of the lower thoracic spine.  Review of the MIP images confirms the above findings.  IMPRESSION: No evidence of pulmonary embolism.  No evidence of acute cardiopulmonary disease.  Impression  1. Atypical, pleuritic chest discomfort, possibly musculoskeletal and/or inflammatory based on tenderness to palpation of the left lower costal margin and reportedly recent fall. Chest CTA did not demonstrate pulmonary embolus. Troponin I levels are not suggestive of ACS and ECG is overall nonspecific. She does have coronary artery calcifications by recent chest CT, although it is not clear that this represents the etiology of her current symptoms.  2. Essential hypertension, on Norvasc and Lopressor as an outpatient.  3. Hyperlipidemia, on Zocor as an outpatient.  4. Recurrent falls.  Recommendations  Discussed with Dr. Rhona Leavenshiu. Would not pursue Myoview study at this time in light of very atypical nature of symptoms from an ischemic perspective. This may only serve to cloud overall  management picture. Follow-up echocardiogram to ensure normal LVEF and no associated myopathy with minimally increased troponin I levels in flat pattern. Consider physical therapy assessment given frequent falls.  Jonelle SidleSamuel G. Sagal Gayton, M.D., F.A.C.C.

## 2017-04-09 ENCOUNTER — Other Ambulatory Visit: Payer: Self-pay

## 2017-05-24 ENCOUNTER — Ambulatory Visit: Payer: Self-pay | Admitting: Urology

## 2017-05-24 ENCOUNTER — Encounter: Payer: Self-pay | Admitting: Urology

## 2017-06-15 ENCOUNTER — Emergency Department (HOSPITAL_COMMUNITY): Payer: Medicare Other

## 2017-06-15 ENCOUNTER — Inpatient Hospital Stay (HOSPITAL_COMMUNITY)
Admission: EM | Admit: 2017-06-15 | Discharge: 2017-06-18 | DRG: 194 | Disposition: A | Discharge status: Home / Self Care | Payer: Medicare Other | Attending: Internal Medicine | Admitting: Internal Medicine

## 2017-06-15 ENCOUNTER — Encounter (HOSPITAL_COMMUNITY): Payer: Self-pay | Admitting: *Deleted

## 2017-06-15 DIAGNOSIS — E869 Volume depletion, unspecified: Secondary | ICD-10-CM | POA: Diagnosis present

## 2017-06-15 DIAGNOSIS — E119 Type 2 diabetes mellitus without complications: Secondary | ICD-10-CM | POA: Diagnosis present

## 2017-06-15 DIAGNOSIS — Z8249 Family history of ischemic heart disease and other diseases of the circulatory system: Secondary | ICD-10-CM | POA: Diagnosis not present

## 2017-06-15 DIAGNOSIS — Z79899 Other long term (current) drug therapy: Secondary | ICD-10-CM

## 2017-06-15 DIAGNOSIS — Z87891 Personal history of nicotine dependence: Secondary | ICD-10-CM | POA: Diagnosis not present

## 2017-06-15 DIAGNOSIS — Z96612 Presence of left artificial shoulder joint: Secondary | ICD-10-CM | POA: Diagnosis present

## 2017-06-15 DIAGNOSIS — Z90710 Acquired absence of both cervix and uterus: Secondary | ICD-10-CM

## 2017-06-15 DIAGNOSIS — J189 Pneumonia, unspecified organism: Principal | ICD-10-CM

## 2017-06-15 DIAGNOSIS — E876 Hypokalemia: Secondary | ICD-10-CM | POA: Diagnosis not present

## 2017-06-15 DIAGNOSIS — R112 Nausea with vomiting, unspecified: Secondary | ICD-10-CM | POA: Diagnosis not present

## 2017-06-15 DIAGNOSIS — N281 Cyst of kidney, acquired: Secondary | ICD-10-CM

## 2017-06-15 DIAGNOSIS — M1990 Unspecified osteoarthritis, unspecified site: Secondary | ICD-10-CM | POA: Diagnosis not present

## 2017-06-15 DIAGNOSIS — M48061 Spinal stenosis, lumbar region without neurogenic claudication: Secondary | ICD-10-CM | POA: Diagnosis present

## 2017-06-15 DIAGNOSIS — T383X5A Adverse effect of insulin and oral hypoglycemic [antidiabetic] drugs, initial encounter: Secondary | ICD-10-CM | POA: Diagnosis not present

## 2017-06-15 DIAGNOSIS — R778 Other specified abnormalities of plasma proteins: Secondary | ICD-10-CM | POA: Diagnosis present

## 2017-06-15 DIAGNOSIS — E1165 Type 2 diabetes mellitus with hyperglycemia: Secondary | ICD-10-CM

## 2017-06-15 DIAGNOSIS — R1013 Epigastric pain: Secondary | ICD-10-CM | POA: Diagnosis not present

## 2017-06-15 DIAGNOSIS — R109 Unspecified abdominal pain: Secondary | ICD-10-CM | POA: Diagnosis not present

## 2017-06-15 DIAGNOSIS — R7989 Other specified abnormal findings of blood chemistry: Secondary | ICD-10-CM | POA: Diagnosis present

## 2017-06-15 DIAGNOSIS — I4510 Unspecified right bundle-branch block: Secondary | ICD-10-CM | POA: Diagnosis not present

## 2017-06-15 DIAGNOSIS — N390 Urinary tract infection, site not specified: Secondary | ICD-10-CM

## 2017-06-15 DIAGNOSIS — I10 Essential (primary) hypertension: Secondary | ICD-10-CM | POA: Diagnosis present

## 2017-06-15 DIAGNOSIS — E782 Mixed hyperlipidemia: Secondary | ICD-10-CM | POA: Insufficient documentation

## 2017-06-15 DIAGNOSIS — N179 Acute kidney failure, unspecified: Secondary | ICD-10-CM | POA: Diagnosis present

## 2017-06-15 DIAGNOSIS — J181 Lobar pneumonia, unspecified organism: Secondary | ICD-10-CM | POA: Diagnosis not present

## 2017-06-15 DIAGNOSIS — R748 Abnormal levels of other serum enzymes: Secondary | ICD-10-CM | POA: Diagnosis present

## 2017-06-15 HISTORY — DX: Spinal stenosis, lumbar region without neurogenic claudication: M48.061

## 2017-06-15 HISTORY — DX: Pneumonia, unspecified organism: J18.9

## 2017-06-15 HISTORY — DX: Diverticulosis of intestine, part unspecified, without perforation or abscess without bleeding: K57.90

## 2017-06-15 HISTORY — DX: Unspecified right bundle-branch block: I45.10

## 2017-06-15 HISTORY — DX: Type 2 diabetes mellitus without complications: E11.9

## 2017-06-15 HISTORY — DX: Other intervertebral disc degeneration, lumbar region: M51.36

## 2017-06-15 HISTORY — DX: Cyst of kidney, acquired: N28.1

## 2017-06-15 HISTORY — DX: Other intervertebral disc degeneration, lumbar region without mention of lumbar back pain or lower extremity pain: M51.369

## 2017-06-15 LAB — COMPREHENSIVE METABOLIC PANEL
ALK PHOS: 78 U/L (ref 38–126)
ALT: 36 U/L (ref 14–54)
ANION GAP: 11 (ref 5–15)
AST: 31 U/L (ref 15–41)
Albumin: 3.5 g/dL (ref 3.5–5.0)
BILIRUBIN TOTAL: 0.7 mg/dL (ref 0.3–1.2)
BUN: 15 mg/dL (ref 6–20)
CO2: 27 mmol/L (ref 22–32)
Calcium: 9.2 mg/dL (ref 8.9–10.3)
Chloride: 100 mmol/L — ABNORMAL LOW (ref 101–111)
Creatinine, Ser: 1.16 mg/dL — ABNORMAL HIGH (ref 0.44–1.00)
GFR, EST AFRICAN AMERICAN: 50 mL/min — AB (ref >60)
GFR, EST NON AFRICAN AMERICAN: 43 mL/min — AB (ref >60)
Glucose, Bld: 129 mg/dL — ABNORMAL HIGH (ref 65–99)
POTASSIUM: 3 mmol/L — AB (ref 3.5–5.1)
Sodium: 138 mmol/L (ref 135–145)
TOTAL PROTEIN: 7.6 g/dL (ref 6.5–8.1)

## 2017-06-15 LAB — TROPONIN I
Troponin I: 0.16 ng/mL (ref <0.03)
Troponin I: 0.17 ng/mL (ref <0.03)

## 2017-06-15 LAB — URINALYSIS, ROUTINE W REFLEX MICROSCOPIC
Bilirubin Urine: NEGATIVE
GLUCOSE, UA: NEGATIVE mg/dL
Hgb urine dipstick: NEGATIVE
KETONES UR: NEGATIVE mg/dL
Leukocytes, UA: NEGATIVE
NITRITE: POSITIVE — AB
PH: 5 (ref 5.0–8.0)
PROTEIN: NEGATIVE mg/dL
Specific Gravity, Urine: 1.038 — ABNORMAL HIGH (ref 1.005–1.030)

## 2017-06-15 LAB — CBC
HEMATOCRIT: 40.9 % (ref 36.0–46.0)
HEMOGLOBIN: 13.8 g/dL (ref 12.0–15.0)
MCH: 29.7 pg (ref 26.0–34.0)
MCHC: 33.7 g/dL (ref 30.0–36.0)
MCV: 88.1 fL (ref 78.0–100.0)
Platelets: 335 10*3/uL (ref 150–400)
RBC: 4.64 MIL/uL (ref 3.87–5.11)
RDW: 12.7 % (ref 11.5–15.5)
WBC: 7.9 10*3/uL (ref 4.0–10.5)

## 2017-06-15 LAB — GLUCOSE, CAPILLARY: GLUCOSE-CAPILLARY: 105 mg/dL — AB (ref 65–99)

## 2017-06-15 LAB — LIPASE, BLOOD: LIPASE: 26 U/L (ref 11–51)

## 2017-06-15 MED ORDER — ENOXAPARIN SODIUM 40 MG/0.4ML ~~LOC~~ SOLN
40.0000 mg | SUBCUTANEOUS | Status: DC
Start: 2017-06-15 — End: 2017-06-18
  Administered 2017-06-15 – 2017-06-17 (×2): 40 mg via SUBCUTANEOUS
  Filled 2017-06-15 (×2): qty 0.4

## 2017-06-15 MED ORDER — IOPAMIDOL (ISOVUE-300) INJECTION 61%: 80.0000 mL | Freq: Once | INTRAVENOUS | Status: DC | PRN

## 2017-06-15 MED ORDER — OMEGA-3-ACID ETHYL ESTERS 1 G PO CAPS
1.0000 g | ORAL_CAPSULE | Freq: Every day | ORAL | Status: DC
  Administered 2017-06-16 – 2017-06-18 (×3): 1 g via ORAL
  Filled 2017-06-15 (×3): qty 1

## 2017-06-15 MED ORDER — DOXYCYCLINE HYCLATE 100 MG IV SOLR
100.0000 mg | Freq: Once | INTRAVENOUS | Status: AC
  Administered 2017-06-15: 100 mg via INTRAVENOUS
  Filled 2017-06-15: qty 100

## 2017-06-15 MED ORDER — MORPHINE SULFATE (PF) 2 MG/ML IV SOLN
2.0000 mg | INTRAVENOUS | Status: DC | PRN
Start: 2017-06-15 — End: 2017-06-15
  Administered 2017-06-15: 2 mg via INTRAVENOUS
  Filled 2017-06-15 (×2): qty 1

## 2017-06-15 MED ORDER — ONDANSETRON HCL 4 MG/2ML IJ SOLN
4.0000 mg | INTRAMUSCULAR | Status: DC | PRN
  Administered 2017-06-15: 4 mg via INTRAVENOUS
  Filled 2017-06-15 (×2): qty 2

## 2017-06-15 MED ORDER — DOXYCYCLINE HYCLATE 100 MG IV SOLR
INTRAVENOUS | Status: AC
  Filled 2017-06-15: qty 100

## 2017-06-15 MED ORDER — SIMVASTATIN 20 MG PO TABS
20.0000 mg | ORAL_TABLET | Freq: Every evening | ORAL | Status: DC
  Administered 2017-06-15 – 2017-06-17 (×3): 20 mg via ORAL
  Filled 2017-06-15 (×3): qty 1

## 2017-06-15 MED ORDER — FAMOTIDINE IN NACL 20-0.9 MG/50ML-% IV SOLN
20.0000 mg | Freq: Once | INTRAVENOUS | Status: AC
  Administered 2017-06-15: 20 mg via INTRAVENOUS
  Filled 2017-06-15: qty 50

## 2017-06-15 MED ORDER — SODIUM CHLORIDE 0.9 % IV BOLUS (SEPSIS)
500.0000 mL | Freq: Once | INTRAVENOUS | Status: AC
  Administered 2017-06-15: 500 mL via INTRAVENOUS

## 2017-06-15 MED ORDER — METOPROLOL TARTRATE 50 MG PO TABS
50.0000 mg | ORAL_TABLET | Freq: Two times a day (BID) | ORAL | Status: DC
  Administered 2017-06-15: 50 mg via ORAL
  Filled 2017-06-15: qty 1

## 2017-06-15 MED ORDER — DEXTROSE 5 % IV SOLN: 1.0000 g | Freq: Once | INTRAVENOUS | Status: DC

## 2017-06-15 MED ORDER — DEXTROSE 5 % IV SOLN: 1.0000 g | INTRAVENOUS | Status: DC

## 2017-06-15 MED ORDER — METFORMIN HCL 500 MG PO TABS
1000.0000 mg | ORAL_TABLET | Freq: Two times a day (BID) | ORAL | Status: DC
  Administered 2017-06-16 – 2017-06-17 (×3): 1000 mg via ORAL
  Filled 2017-06-15 (×3): qty 2

## 2017-06-15 MED ORDER — PSYLLIUM 95 % PO PACK
1.0000 | PACK | Freq: Two times a day (BID) | ORAL | Status: DC
  Administered 2017-06-16 – 2017-06-17 (×3): 1 via ORAL
  Filled 2017-06-15 (×11): qty 1

## 2017-06-15 MED ORDER — INSULIN ASPART 100 UNIT/ML ~~LOC~~ SOLN
0.0000 [IU] | Freq: Three times a day (TID) | SUBCUTANEOUS | Status: DC
  Administered 2017-06-16: 1 [IU] via SUBCUTANEOUS
  Administered 2017-06-16: 2 [IU] via SUBCUTANEOUS
  Administered 2017-06-17 (×3): 1 [IU] via SUBCUTANEOUS
  Administered 2017-06-18: 2 [IU] via SUBCUTANEOUS

## 2017-06-15 MED ORDER — DEXTROSE 5 % IV SOLN
1.0000 g | Freq: Once | INTRAVENOUS | Status: AC
  Administered 2017-06-15: 1 g via INTRAVENOUS
  Filled 2017-06-15: qty 10

## 2017-06-15 MED ORDER — AMLODIPINE BESYLATE 5 MG PO TABS
10.0000 mg | ORAL_TABLET | Freq: Every day | ORAL | Status: DC
  Administered 2017-06-16 – 2017-06-18 (×3): 10 mg via ORAL
  Filled 2017-06-15 (×3): qty 2

## 2017-06-15 MED ORDER — DOXYCYCLINE HYCLATE 100 MG PO TABS
100.0000 mg | ORAL_TABLET | Freq: Two times a day (BID) | ORAL | Status: DC
  Administered 2017-06-16 – 2017-06-18 (×5): 100 mg via ORAL
  Filled 2017-06-15 (×5): qty 1

## 2017-06-15 MED ORDER — DEXTROSE 5 % IV SOLN: 500.0000 mg | Freq: Once | INTRAVENOUS | Status: DC

## 2017-06-15 MED ORDER — IOPAMIDOL (ISOVUE-370) INJECTION 76%
80.0000 mL | Freq: Once | INTRAVENOUS | Status: AC | PRN
  Administered 2017-06-15: 80 mL via INTRAVENOUS

## 2017-06-15 MED ORDER — ENSURE ENLIVE PO LIQD: 237.0000 mL | Freq: Two times a day (BID) | ORAL | Status: DC

## 2017-06-15 MED ORDER — DEXTROSE 5 % IV SOLN
1.0000 g | INTRAVENOUS | Status: DC
  Administered 2017-06-16 – 2017-06-17 (×2): 1 g via INTRAVENOUS
  Filled 2017-06-15 (×5): qty 10

## 2017-06-15 MED ORDER — DEXTROSE 5 % IV SOLN
500.0000 mg | INTRAVENOUS | Status: DC
  Administered 2017-06-15: 500 mg via INTRAVENOUS
  Filled 2017-06-15: qty 500

## 2017-06-15 MED ORDER — DOCUSATE SODIUM 100 MG PO CAPS: 200.0000 mg | ORAL_CAPSULE | Freq: Two times a day (BID) | ORAL | Status: DC | PRN

## 2017-06-15 MED ORDER — TRAMADOL HCL 50 MG PO TABS
50.0000 mg | ORAL_TABLET | Freq: Four times a day (QID) | ORAL | Status: DC | PRN
  Administered 2017-06-16 – 2017-06-17 (×2): 50 mg via ORAL
  Filled 2017-06-15 (×2): qty 1

## 2017-06-15 MED ORDER — POTASSIUM CHLORIDE IN NACL 20-0.9 MEQ/L-% IV SOLN
INTRAVENOUS | Status: DC
  Administered 2017-06-15 – 2017-06-17 (×3): via INTRAVENOUS

## 2017-06-15 MED ORDER — SODIUM CHLORIDE 0.9 % IV SOLN
INTRAVENOUS | Status: DC
  Administered 2017-06-15: 16:00:00 via INTRAVENOUS

## 2017-06-15 MED ORDER — INSULIN ASPART 100 UNIT/ML ~~LOC~~ SOLN: 0.0000 [IU] | Freq: Every day | SUBCUTANEOUS | Status: DC

## 2017-06-15 NOTE — Progress Notes (Signed)
Pharmacy Antibiotic Note  Haley Nguyen is a 83 y.o. female admitted on 06/15/2017 with pneumonia.  Pharmacy has been consulted for Rocephin and Zithromax dosing.  Plan: Rocephin 1gm IV every 24 hours. Azithromycin 500mg  IV every 24 hours. Dose stable for age, weight, renal function and indication. No pharmacokinetic monitoring needed. Sign off.   Height: 5\' 5"  (165.1 cm) Weight: 175 lb (79.4 kg) IBW/kg (Calculated) : 57  Temp (24hrs), Avg:97.4 F (36.3 C), Min:97.4 F (36.3 C), Max:97.4 F (36.3 C)   Recent Labs Lab 06/15/17 1427  WBC 7.9  CREATININE 1.16*    Estimated Creatinine Clearance: 39.6 mL/min (A) (by C-G formula based on SCr of 1.16 mg/dL (H)).    No Known Allergies  Antimicrobials this admission: Rocephin 8/7 >>  Azith 8/7 >>  Doxy 8/7 x 1 in ED  Dose adjustments this admission: n/a   Microbiology results:  BCx:   UCx:    Sputum:    MRSA PCR:   Thank you for allowing pharmacy to be a part of this patient's care.  Mady GemmaHayes, Litzi Binning R 06/15/2017 6:18 PM

## 2017-06-15 NOTE — ED Provider Notes (Signed)
[facility] Provider Note   CSN: [id number] Arrival date & time: 06/15/17  1329     History   Chief Complaint Chief Complaint  Patient presents with  . Abdominal Pain    HPI Haley Nguyen is a [age] y.o. female.   Abdominal Pain      Pt was seen at 1545. Per pt and her family, c/o gradual onset and persistence of constant abd "pain" for the past 2 weeks. Has been associated with nausea and "dry heaving." Pt's family states pt's symptoms began after they came back from a trip to [place]. Pt also c/o mild SOB. Denies fevers, no CP/palpitations, no cough, no diarrhea, no black or blood in stools, no rash, no calf/LE pain or unilateral swelling.    Past Medical History:  Diagnosis Date  . Arthritis   . DDD (degenerative disc disease), lumbar   . Diabetes mellitus without complication (HCC)   . Diverticulosis   . Essential hypertension   . RLQ abdominal pain 05/29/2013    Patient Active Problem List   Diagnosis Date Noted  . Fall (on) (from) other stairs and steps, initial encounter 06/30/2016  . Chest pain 06/29/2016  . RLQ abdominal pain 05/29/2013    Past Surgical History:  Procedure Laterality Date  . ABDOMINAL HYSTERECTOMY    . INNER EAR SURGERY     "had ear surgery years ago and there's metal left in there"  . LAPAROSCOPIC LYSIS OF ADHESIONS N/A 03/02/2013   Procedure: LAPAROSCOPIC LYSIS OF ADHESIONS;  Surgeon: Lazaro ArmsLuther H Eure, MD;  Location: [facility];  Service: Gynecology;  Laterality: N/A;  . LAPAROSCOPIC SALPINGO OOPHERECTOMY Bilateral 03/02/2013   Procedure: LAPAROSCOPIC SALPINGO OOPHORECTOMY;  Surgeon: Lazaro ArmsLuther H Eure, MD;  Location: [facility];  Service: Gynecology;  Laterality: Bilateral;    OB History    Gravida Para Term Preterm AB Living   1 1       1    SAB TAB Ectopic Multiple Live Births           1       Home Medications    Prior to Admission medications   Medication Sig Start Date End Date Taking? Authorizing Provider  acetaminophen  (TYLENOL) 500 MG tablet Take 500 mg by mouth every 6 (six) hours as needed for mild pain or moderate pain.    [provider]  Alpha-D-Galactosidase Charlyne Quale(BEANO) TABS Take 1-2 tablets by mouth daily as needed (for flatulence).    [provider]  amLODipine (NORVASC) 10 MG tablet Take 10 mg by mouth every morning.     [provider]  cyclobenzaprine (FLEXERIL) 5 MG tablet Take 1 tablet (5 mg total) by mouth 2 (two) times daily as needed for muscle spasms. 06/28/16   Pricilla LovelessGoldston, Scott, MD  diphenhydrAMINE (BENADRYL) 25 mg capsule Take 25 mg by mouth every 6 (six) hours as needed for allergies.    [provider]  docusate sodium (COLACE) 100 MG capsule Take 200 mg by mouth 2 (two) times daily.     [provider]  esomeprazole (NEXIUM) 20 MG capsule Take 20 mg by mouth daily at 12 noon.    [provider]  Garlic 1000 MG CAPS Take 1 capsule by mouth daily.    [provider]  ketorolac (TORADOL) 10 MG tablet Take 1 tablet (10 mg total) by mouth every 6 (six) hours as needed. 06/30/16   Jerald Kiefhiu, Stephen K, MD  Magnesium Oxide (PHILLIPS) 500 MG (LAX) TABS Take 1-2 tablets by mouth daily.  [provider]  metoprolol (LOPRESSOR) 50 MG tablet Take 50 mg by mouth 2 (two) times daily.    [provider]  Omega-3 Fatty Acids (FISH OIL) 1000 MG CAPS Take 1 capsule by mouth daily.    [provider]  polycarbophil (FIBERCON) 625 MG tablet Take 625-1,250 mg by mouth daily.     [provider]  promethazine (PHENERGAN) 25 MG tablet Take 25 mg by mouth every 6 (six) hours as needed for nausea or vomiting.  12/24/15   [provider]  simvastatin (ZOCOR) 40 MG tablet Take 20 mg by mouth every evening.    [provider]  traMADol (ULTRAM) 50 MG tablet Take 50 mg by mouth every 6 (six) hours as needed for pain.    [provider]    Family History Family History  Problem Relation Age of Onset  .  Heart disease Father   . Heart disease Mother     Social History Social History  Substance Use Topics  . Smoking status: Former Smoker    Years: 30.00    Types: Cigarettes  . Smokeless tobacco: Former Neurosurgeon    Types: Chew, Snuff  . Alcohol use 0.0 oz/week     Comment: occasional drink     Allergies   Patient has no known allergies.   Review of Systems Review of Systems  Gastrointestinal: Positive for abdominal pain.  ROS: Statement: All systems negative except as marked or noted in the HPI; Constitutional: Negative for fever and chills. ; ; Eyes: Negative for eye pain, redness and discharge. ; ; ENMT: Negative for ear pain, hoarseness, nasal congestion, sinus pressure and sore throat. ; ; Cardiovascular: Negative for chest pain, palpitations, diaphoresis, and peripheral edema. ; ; Respiratory: +SOB. Negative for cough, wheezing and stridor. ; ; Gastrointestinal: +nausea, abd pain. Negative for vomiting, diarrhea, blood in stool, hematemesis, jaundice and rectal bleeding. . ; ; Genitourinary: Negative for dysuria, flank pain and hematuria. ; ; Musculoskeletal: Negative for back pain and neck pain. Negative for swelling and trauma.; ; Skin: Negative for pruritus, rash, abrasions, blisters, bruising and skin lesion.; ; Neuro: Negative for headache, lightheadedness and neck stiffness. Negative for weakness, altered level of consciousness, altered mental status, extremity weakness, paresthesias, involuntary movement, seizure and syncope.       Physical Exam Updated Vital Signs BP 136/69   Pulse 64   Temp (!) 97.4 F (36.3 C) (Oral)   Resp 16   Ht 5\' 5"  (1.651 m)   Wt 79.4 kg (175 lb)   SpO2 97%   BMI 29.12 kg/m   Physical Exam 1550: Physical examination:  Nursing notes reviewed; Vital signs and O2 SAT reviewed;  Constitutional: Well developed, Well nourished, In no acute distress; Head:  Normocephalic, atraumatic; Eyes: EOMI, PERRL, No scleral icterus; ENMT: Mouth and pharynx  normal, Mucous membranes dry; Neck: Supple, Full range of motion, No lymphadenopathy; Cardiovascular: Regular rate and rhythm, No gallop; Respiratory: Breath sounds clear & equal bilaterally, No wheezes.  Speaking full sentences with ease, Normal respiratory effort/excursion; Chest: Nontender, Movement normal; Abdomen: Soft, +mid-epigastric > generalized tenderness to palp. No rebound or guarding. Nondistended, Normal bowel sounds; Genitourinary: No CVA tenderness; Spine:  No midline CS, TS, LS tenderness. No rash.;; Extremities: Pulses normal, No tenderness, No edema, No calf edema or asymmetry.; Neuro: AA&Ox3, Major CN grossly intact.  Speech clear. No gross focal motor or sensory deficits in extremities.; Skin: Color normal, Warm, Dry.   ED Treatments / Results  Labs (all  labs ordered are listed, but only abnormal results are displayed)   EKG  EKG Interpretation  Date/Time:  Tuesday June 15 2017 13:55:09 EDT Ventricular Rate:  73 PR Interval:  166 QRS Duration: 136 QT Interval:  458 QTC Calculation: 504 R Axis:   97 Text Interpretation:  Normal sinus rhythm Right bundle branch block T wave abnormality, consider inferior ischemia When compared with ECG of 06/29/2016 No significant change was found Confirmed by Samuel Jester [phone]) on 06/15/2017 3:38:10 PM       Radiology   Procedures Procedures (including critical care time)  Medications Ordered in ED Medications  sodium chloride 0.9 % bolus 500 mL (not administered)  0.9 %  sodium chloride infusion (not administered)  famotidine (PEPCID) IVPB 20 mg premix (not administered)  morphine 2 MG/ML injection 2 mg (not administered)  ondansetron (ZOFRAN) injection 4 mg (not administered)     Initial Impression / Assessment and Plan / ED Course  I have reviewed the triage vital signs and the nursing notes.  Pertinent labs & imaging results that were available during my care of the patient were reviewed by me and considered in  my medical decision making (see chart for details).  MDM Reviewed: previous chart, nursing note and vitals Reviewed previous: labs, ECG and CT scan Interpretation: labs, ECG, x-ray and CT scan    Results for orders placed or performed during the hospital encounter of 06/15/17  Lipase, blood  Result Value Ref Range   Lipase 26 11 - 51 U/L  Comprehensive metabolic panel  Result Value Ref Range   Sodium 138 135 - 145 mmol/L   Potassium 3.0 (L) 3.5 - 5.1 mmol/L   Chloride 100 (L) 101 - 111 mmol/L   CO2 27 22 - 32 mmol/L   Glucose, Bld 129 (H) 65 - 99 mg/dL   BUN 15 6 - 20 mg/dL   Creatinine, Ser 3.08 (H) 0.44 - 1.00 mg/dL   Calcium 9.2 8.9 - 65.7 mg/dL   Total Protein 7.6 6.5 - 8.1 g/dL   Albumin 3.5 3.5 - 5.0 g/dL   AST 31 15 - 41 U/L   ALT 36 14 - 54 U/L   Alkaline Phosphatase 78 38 - 126 U/L   Total Bilirubin 0.7 0.3 - 1.2 mg/dL   GFR calc non Af Amer 43 (L) >60 mL/min   GFR calc Af Amer 50 (L) >60 mL/min   Anion gap 11 5 - 15  CBC  Result Value Ref Range   WBC 7.9 4.0 - 10.5 K/uL   RBC 4.64 3.87 - 5.11 MIL/uL   Hemoglobin 13.8 12.0 - 15.0 g/dL   HCT 84.6 96.2 - 95.2 %   MCV 88.1 78.0 - 100.0 fL   MCH 29.7 26.0 - 34.0 pg   MCHC 33.7 30.0 - 36.0 g/dL   RDW 84.1 32.4 - 40.1 %   Platelets 335 150 - 400 K/uL  Urinalysis, Routine w reflex microscopic  Result Value Ref Range   Color, Urine YELLOW YELLOW   APPearance HAZY (A) CLEAR   Specific Gravity, Urine 1.038 (H) 1.005 - 1.030   pH 5.0 5.0 - 8.0   Glucose, UA NEGATIVE NEGATIVE mg/dL   Hgb urine dipstick NEGATIVE NEGATIVE   Bilirubin Urine NEGATIVE NEGATIVE   Ketones, ur NEGATIVE NEGATIVE mg/dL   Protein, ur NEGATIVE NEGATIVE mg/dL   Nitrite POSITIVE (A) NEGATIVE   Leukocytes, UA NEGATIVE NEGATIVE   RBC / HPF 0-5 0 - 5 RBC/hpf   WBC, UA 6-30 0 -  5 WBC/hpf   Bacteria, UA FEW (A) NONE SEEN   Squamous Epithelial / LPF 0-5 (A) NONE SEEN   Mucous PRESENT    Hyaline Casts, UA PRESENT   Troponin I  Result Value Ref  Range   Troponin I 0.16 (HH) <0.03 ng/mL   Dg Chest 2 View Result Date: 06/15/2017 CLINICAL DATA:  Short of breath EXAM: CHEST  2 VIEW COMPARISON:  06/29/2016 FINDINGS: Heart size and vascularity normal. Atherosclerotic aortic arch. Negative for infiltrate effusion or mass. Mild apical scarring bilaterally. IMPRESSION: No active cardiopulmonary disease. Electronically Signed   By: Marlan Palau M.D.   On: 06/15/2017 14:10   Ct Angio Chest Pe W/cm &/or Wo Cm Result Date: 06/15/2017 CLINICAL DATA:  Chest pain. Epigastric pain. Nausea and loss of appetite EXAM: CT ANGIOGRAPHY CHEST CT ABDOMEN AND PELVIS WITH CONTRAST TECHNIQUE: Multidetector CT imaging of the chest was performed using the standard protocol during bolus administration of intravenous contrast. Multiplanar CT image reconstructions and MIPs were obtained to evaluate the vascular anatomy. Multidetector CT imaging of the abdomen and pelvis was performed using the standard protocol during bolus administration of intravenous contrast. CONTRAST:  80 mL Isovue 370 nonionic COMPARISON:  CT abdomen and pelvis December 24, 2015 ; pelvic ultrasound January 05, 2013 FINDINGS: CTA CHEST FINDINGS Cardiovascular: There is no demonstrable pulmonary embolus. There is no thoracic aortic aneurysm or dissection. There is atherosclerotic calcification in the proximal right common and left subclavian arteries. There are foci of calcification in the thoracic aorta. There are foci of coronary artery calcification. The pericardium is not appreciably thickened. There are areas of atherosclerotic plaque in descending thoracic aorta. No ulceration evident. Mediastinum/Nodes: Thyroid appears unremarkable. There is no appreciable thoracic adenopathy. Lungs/Pleura: There is mild scarring in the apices. There is patchy airspace opacity in the posterior aspect of the apical segment of the right upper lobe. There is mild bibasilar atelectasis. There is no pleural effusion or  pleural thickening evident. Musculoskeletal: There is degenerative change in the thoracic spine. There are no blastic or lytic bone lesions. Review of the MIP images confirms the above findings. CT ABDOMEN and PELVIS FINDINGS Hepatobiliary: No focal liver lesions are apparent. Gallbladder is mildly distended without appreciable gallbladder wall thickening. There is no appreciable biliary duct dilatation. Pancreas: No pancreatic mass or inflammatory focus. Spleen: No splenic lesions are evident. Adrenals/Urinary Tract: Adrenals appear normal bilaterally. There is a cyst arising from the anterior upper to mid left kidney measuring 1.9 x 1.9 cm. There is a cyst in the mid right kidney measuring 1.0 x 1.0 cm. There is scarring in the posterior mid to lower right kidney. There is no evident hydronephrosis on either side. There is no renal or ureteral calculus on either side. Urinary bladder is midline with wall thickness within normal limits for degree of distention. Stomach/Bowel: There are scattered sigmoid diverticula without diverticulitis. There is no bowel wall or mesenteric thickening. There is no bowel obstruction. No evident free air or portal venous air. Vascular/Lymphatic: There is atherosclerotic calcification in the aorta and iliac arteries without aneurysm. Areas of atherosclerotic plaque noted in the aorta. At the proximal right common iliac artery, there appears to be subtotal occlusion due to extensive calcification. No adenopathy is appreciable in the abdomen or pelvis. Reproductive: Uterus absent. There is a left ovarian cyst measuring 3.6 x 2.9 cm, stable. No new pelvic mass. Other: No evident. Appendiceal region lesion. No abscess or ascites appreciable in the abdomen or pelvis. There is a small  ventral hernia containing only fat. Musculoskeletal: There is degenerative change throughout the lumbar spine. There is spinal stenosis at L3-4 and L4-5 due to disc protrusion as well as bony hypertrophy.  There are no blastic or lytic bone lesions. There is no intramuscular or abdominal wall lesion. Review of the MIP images confirms the above findings. IMPRESSION: CT angiogram chest: 1.  No demonstrable pulmonary embolus. 2. Atherosclerotic calcification in the aorta with areas of plaque in the descending thoracic aorta. No hemodynamically significant obstruction or ulceration seen. No thoracic aortic aneurysm or dissection. 3. Focal area of pneumonia in the posterior aspect of the apical segment of the right upper lobe. Mild bibasilar atelectatic change. 4.  No evident adenopathy. CT abdomen and pelvis: 1. Gallbladder distended without wall thickening. The significance of this finding is uncertain. 2. Extensive aortoiliac atherosclerosis. There appears to be subtotal occlusion of the proximal right common iliac artery due to calcification. 3. No bowel obstruction. No abscess. No periappendiceal region inflammation. Scattered sigmoid diverticula without diverticulitis. 4.  No renal or ureteral calculus.  No hydronephrosis. 5.  Moderate spinal stenosis at L3-4 and L4-5, multifactorial. 6.  Small ventral hernia containing only fat. Aortic Atherosclerosis (ICD10-I70.0). Electronically Signed   By: Bretta Bang III M.D.   On: 06/15/2017 17:42   Ct Abdomen Pelvis W Contrast Result Date: 06/15/2017 CLINICAL DATA:  Chest pain. Epigastric pain. Nausea and loss of appetite EXAM: CT ANGIOGRAPHY CHEST CT ABDOMEN AND PELVIS WITH CONTRAST TECHNIQUE: Multidetector CT imaging of the chest was performed using the standard protocol during bolus administration of intravenous contrast. Multiplanar CT image reconstructions and MIPs were obtained to evaluate the vascular anatomy. Multidetector CT imaging of the abdomen and pelvis was performed using the standard protocol during bolus administration of intravenous contrast. CONTRAST:  80 mL Isovue 370 nonionic COMPARISON:  CT abdomen and pelvis December 24, 2015 ; pelvic ultrasound  January 05, 2013 FINDINGS: CTA CHEST FINDINGS Cardiovascular: There is no demonstrable pulmonary embolus. There is no thoracic aortic aneurysm or dissection. There is atherosclerotic calcification in the proximal right common and left subclavian arteries. There are foci of calcification in the thoracic aorta. There are foci of coronary artery calcification. The pericardium is not appreciably thickened. There are areas of atherosclerotic plaque in descending thoracic aorta. No ulceration evident. Mediastinum/Nodes: Thyroid appears unremarkable. There is no appreciable thoracic adenopathy. Lungs/Pleura: There is mild scarring in the apices. There is patchy airspace opacity in the posterior aspect of the apical segment of the right upper lobe. There is mild bibasilar atelectasis. There is no pleural effusion or pleural thickening evident. Musculoskeletal: There is degenerative change in the thoracic spine. There are no blastic or lytic bone lesions. Review of the MIP images confirms the above findings. CT ABDOMEN and PELVIS FINDINGS Hepatobiliary: No focal liver lesions are apparent. Gallbladder is mildly distended without appreciable gallbladder wall thickening. There is no appreciable biliary duct dilatation. Pancreas: No pancreatic mass or inflammatory focus. Spleen: No splenic lesions are evident. Adrenals/Urinary Tract: Adrenals appear normal bilaterally. There is a cyst arising from the anterior upper to mid left kidney measuring 1.9 x 1.9 cm. There is a cyst in the mid right kidney measuring 1.0 x 1.0 cm. There is scarring in the posterior mid to lower right kidney. There is no evident hydronephrosis on either side. There is no renal or ureteral calculus on either side. Urinary bladder is midline with wall thickness within normal limits for degree of distention. Stomach/Bowel: There are scattered sigmoid diverticula without diverticulitis.  There is no bowel wall or mesenteric thickening. There is no bowel  obstruction. No evident free air or portal venous air. Vascular/Lymphatic: There is atherosclerotic calcification in the aorta and iliac arteries without aneurysm. Areas of atherosclerotic plaque noted in the aorta. At the proximal right common iliac artery, there appears to be subtotal occlusion due to extensive calcification. No adenopathy is appreciable in the abdomen or pelvis. Reproductive: Uterus absent. There is a left ovarian cyst measuring 3.6 x 2.9 cm, stable. No new pelvic mass. Other: No evident. Appendiceal region lesion. No abscess or ascites appreciable in the abdomen or pelvis. There is a small ventral hernia containing only fat. Musculoskeletal: There is degenerative change throughout the lumbar spine. There is spinal stenosis at L3-4 and L4-5 due to disc protrusion as well as bony hypertrophy. There are no blastic or lytic bone lesions. There is no intramuscular or abdominal wall lesion. Review of the MIP images confirms the above findings. IMPRESSION: CT angiogram chest: 1.  No demonstrable pulmonary embolus. 2. Atherosclerotic calcification in the aorta with areas of plaque in the descending thoracic aorta. No hemodynamically significant obstruction or ulceration seen. No thoracic aortic aneurysm or dissection. 3. Focal area of pneumonia in the posterior aspect of the apical segment of the right upper lobe. Mild bibasilar atelectatic change. 4.  No evident adenopathy. CT abdomen and pelvis: 1. Gallbladder distended without wall thickening. The significance of this finding is uncertain. 2. Extensive aortoiliac atherosclerosis. There appears to be subtotal occlusion of the proximal right common iliac artery due to calcification. 3. No bowel obstruction. No abscess. No periappendiceal region inflammation. Scattered sigmoid diverticula without diverticulitis. 4.  No renal or ureteral calculus.  No hydronephrosis. 5.  Moderate spinal stenosis at L3-4 and L4-5, multifactorial. 6.  Small ventral hernia  containing only fat. Aortic Atherosclerosis (ICD10-I70.0). Electronically Signed   By: Bretta Bang III M.D.   On: 06/15/2017 17:42    1820:  Troponin elevated, no acute changes on EKG and pt denies CP. +UTI, UC pending. CAP on CT scan; will order IV rocephin and doxycycline (pt has prolonged QT on EKG). Dx and testing d/w pt and family.  Questions answered.  Verb understanding, agreeable to admit.  T/C to [facility] Dr. Arlean Hopping, case discussed, including:  HPI, pertinent PM/SHx, VS/PE, dx testing, ED course and treatment:  Agreeable to admit.     Final Clinical Impressions(s) / ED Diagnoses   Final diagnoses:  None    New Prescriptions New Prescriptions   No medications on file      Samuel Jester, DO 06/18/17 [id number]

## 2017-06-15 NOTE — ED Notes (Signed)
EKG given to Dr. McManus.  

## 2017-06-15 NOTE — ED Notes (Signed)
Report given to Allison, RN at this time.

## 2017-06-15 NOTE — ED Notes (Addendum)
CRITICAL VALUE ALERT  Critical Value: Troponin 0.16  Date & Time Notied:  06/15/17 at 1320  Provider Notified: Clarene DukeMcManus  Orders Received/Actions taken: MD made aware

## 2017-06-15 NOTE — ED Triage Notes (Signed)
Pt comes in with epigastric pain starting 2 weeks ago. This pain moves into her back. Grand-daughter accompanies her and states that she hasn't been wanting to eat and has nausea when she does eat. Pt went to PCP today and they did an EKG which showed a right BBB. She has had some SOB. Pt was recently on a trip to [place] with fNAD noted. ew stops.

## 2017-06-15 NOTE — H&P (Addendum)
[facility] History and Physical  Haley Nguyen ZOX:[id number] DOB: 1933-01-06 DOA: 06/15/2017  Referring physician: Dr Clarene Duke PCP: Smith , MD   Chief Complaint: Chest pain /abd pain  HPI: Haley Nguyen is a 83 y.o. female with history of HTN, DDD, DJD and recently dx'd with DM2, presenting to ED with 2 week hx of intermittent chest pain and upper abd pain. She says her upper back on both sides have been hurting, also +nausea and dry heaving.  The daughter gives history of going to a hospital in [facility] 2 wks ago for polyuria/ polydipsia and was dx'd with new onset DM2 with BS > 600.  They didn't admit her since they were leaving for [place] the next day, got the BS down with insulin and IVF's and dc'd her on metformin.  She has been having nausea/ dry heaves after taking metformin.  She went to PCP today and he sent her here for possible PE.  CT angio chest showed no PE but small infiltrate upper lobe suspected PNA. Asked to see for admission.   Denies any fevers, chills, joint pain , dysuria. No diarrhea.  No prod cough or wheezing. Hx L shoulder replacement.    Home meds: -norvasc 10 qd/ lopressor 50 bid -ultram 50 qid prnr/ flexeril prn/ benadryl prn/ ketorolac 10 mg qid prn/ phenergan pnr -zocor 20 hs/ fibercon/ fish oil/ nexium   ROS  no joint pain   no HA  no blurry vision  no rash  no difficulty voiding  no change in urine color    Past Medical History  Past Medical History:  Diagnosis Date  . Arthritis   . DDD (degenerative disc disease), lumbar   . Diabetes mellitus without complication (HCC)   . Diverticulosis   . Essential hypertension   . RLQ abdominal pain 05/29/2013   Past Surgical History  Past Surgical History:  Procedure Laterality Date  . ABDOMINAL HYSTERECTOMY    . INNER EAR SURGERY     "had ear surgery years ago and there's metal left in there"  . LAPAROSCOPIC LYSIS OF ADHESIONS N/A 03/02/2013   Procedure: LAPAROSCOPIC LYSIS OF ADHESIONS;   Surgeon: Lazaro Arms, MD;  Location: [facility];  Service: Gynecology;  Laterality: N/A;  . LAPAROSCOPIC SALPINGO OOPHERECTOMY Bilateral 03/02/2013   Procedure: LAPAROSCOPIC SALPINGO OOPHORECTOMY;  Surgeon: Lazaro Arms, MD;  Location: [facility];  Service: Gynecology;  Laterality: Bilateral;   Family History  Family History  Problem Relation Age of Onset  . Heart disease Father   . Heart disease Mother    Social History  reports that she has quit smoking. Her smoking use included Cigarettes. She quit after 30.00 years of use. She has quit using smokeless tobacco. Her smokeless tobacco use included Chew and Snuff. She reports that she drinks alcohol. She reports that she does not use drugs. Allergies No Known Allergies Home medications Prior to Admission medications   Medication Sig Start Date End Date Taking? Authorizing Provider  acetaminophen (TYLENOL) 500 MG tablet Take 500 mg by mouth every 6 (six) hours as needed for mild pain or moderate pain.    [provider]  Alpha-D-Galactosidase Charlyne Quale) TABS Take 1-2 tablets by mouth daily as needed (for flatulence).    [provider]  amLODipine (NORVASC) 10 MG tablet Take 10 mg by mouth every morning.     [provider]  cyclobenzaprine (FLEXERIL) 5 MG tablet Take 1 tablet (5 mg total) by mouth 2 (two) times daily as needed  for muscle spasms. 06/28/16   Pricilla Loveless, MD  diphenhydrAMINE (BENADRYL) 25 mg capsule Take 25 mg by mouth every 6 (six) hours as needed for allergies.    [provider]  docusate sodium (COLACE) 100 MG capsule Take 200 mg by mouth 2 (two) times daily.     [provider]  esomeprazole (NEXIUM) 20 MG capsule Take 20 mg by mouth daily at 12 noon.    [provider]  Garlic 1000 MG CAPS Take 1 capsule by mouth daily.    [provider]  ketorolac (TORADOL) 10 MG tablet Take 1 tablet (10 mg total) by mouth every 6 (six) hours as needed. 06/30/16   Jerald Kief,  MD  Magnesium Oxide (PHILLIPS) 500 MG (LAX) TABS Take 1-2 tablets by mouth daily.    [provider]  metoprolol (LOPRESSOR) 50 MG tablet Take 50 mg by mouth 2 (two) times daily.    [provider]  Omega-3 Fatty Acids (FISH OIL) 1000 MG CAPS Take 1 capsule by mouth daily.    [provider]  polycarbophil (FIBERCON) 625 MG tablet Take 625-1,250 mg by mouth daily.     [provider]  promethazine (PHENERGAN) 25 MG tablet Take 25 mg by mouth every 6 (six) hours as needed for nausea or vomiting.  12/24/15   [provider]  simvastatin (ZOCOR) 40 MG tablet Take 20 mg by mouth every evening.    [provider]  traMADol (ULTRAM) 50 MG tablet Take 50 mg by mouth every 6 (six) hours as needed for pain.    [provider]   Liver Function Tests  Recent Labs Lab 06/15/17 1427  AST 31  ALT 36  ALKPHOS 78  BILITOT 0.7  PROT 7.6  ALBUMIN 3.5    Recent Labs Lab 06/15/17 1427  LIPASE 26   CBC  Recent Labs Lab 06/15/17 1427  WBC 7.9  HGB 13.8  HCT 40.9  MCV 88.1  PLT 335   Basic Metabolic Panel  Recent Labs Lab 06/15/17 1427  NA 138  K 3.0*  CL 100*  CO2 27  GLUCOSE 129*  BUN 15  CREATININE 1.16*  CALCIUM 9.2     Vitals:   06/15/17 1544 06/15/17 1600 06/15/17 1700 06/15/17 1800  BP: 136/69 (!) 139/55 (!) 134/57 (!) 152/57  Pulse: 64 60 67 69  Resp: 16 14 19  (!) 22  Temp:      TempSrc:      SpO2: 97% 100% 99% 97%  Weight:      Height:       Exam: Gen elderly WF, dry mouth, no distress, calm No rash, cyanosis or gangrene; skin turgor down Sclera anicteric, throat clear  No jvd or bruits Chest scattered coarse rales R base, L base RRR no MRG Abd soft ntnd no mass or ascites +bs GU defer MS no joint effusions or deformity Ext no LE or UE edema / no wounds or ulcers Neuro is alert, Ox 3 , nf  EKG (independ reviewed) > RBBB 73 bpm, no ischemic changes CXR (independ reviewed) > no active  disease CT chest: 1.  No demonstrable pulmonary embolus. 2. Atherosclerotic calcification in the aorta with areas of plaque in the descending thoracic aorta. No hemodynamically significant obstruction or ulceration seen. No thoracic aortic aneurysm or dissection. 3. Focal area of pneumonia in the posterior aspect of the apical segment of the right upper lobe. Mild bibasilar atelectatic change. 4.  No evident adenopathy.  CT abdomen  and pelvis: 1. Gallbladder distended without wall thickening. The significance of this finding is uncertain. 2. Extensive aortoiliac atherosclerosis. There appears to be subtotal occlusion of the proximal right common iliac artery due to calcification. 3. No bowel obstruction. No abscess. No periappendiceal region inflammation. Scattered sigmoid diverticula without diverticulitis. 4.  No renal or ureteral calculus.  No hydronephrosis. 5.  Moderate spinal stenosis at L3-4 and L4-5, multifactorial. 6.  Small ventral hernia containing only fat.   Home meds: -norvasc 10 qd/ lopressor 50 bid -ultram 50 qid prnr/ flexeril prn/ benadryl prn/ ketorolac 10 mg qid prn/ phenergan pnr -zocor 20 hs/ fibercon/ fish oil/ nexium/ colace   Na 138 K 3.0 BUN 15  Cr 1.16  Ca 9.2  WBC 7k  Trop 0.16  Hb 13     Assessment: 1. Chest pain / infiltrate on chest CT - admit for CAP, IV rocephin/ po doxy (^QT 504 msec) and IVF"s.  2. Vol depletion 3. Nausea/ vomiting  4. DM2 - recently dx'd 2 wks ago, started on metformin 5. HTN - bp's good, on CCB/ BB at home   Plan - admit, IVF's and IV abx. SSI + metformin.       Delano MetzSCHERTZ,Stanely Sexson D [facility] Pager [phone]   If 7PM-7AM, please contact night-coverage [email or web address] Password [email or web address] 06/15/2017, 6:26 PM

## 2017-06-16 ENCOUNTER — Encounter (HOSPITAL_COMMUNITY): Payer: Self-pay | Admitting: Internal Medicine

## 2017-06-16 ENCOUNTER — Inpatient Hospital Stay (HOSPITAL_COMMUNITY): Payer: Medicare Other

## 2017-06-16 DIAGNOSIS — I10 Essential (primary) hypertension: Secondary | ICD-10-CM

## 2017-06-16 DIAGNOSIS — R778 Other specified abnormalities of plasma proteins: Secondary | ICD-10-CM | POA: Diagnosis present

## 2017-06-16 DIAGNOSIS — I4510 Unspecified right bundle-branch block: Secondary | ICD-10-CM

## 2017-06-16 DIAGNOSIS — R748 Abnormal levels of other serum enzymes: Secondary | ICD-10-CM

## 2017-06-16 DIAGNOSIS — E876 Hypokalemia: Secondary | ICD-10-CM | POA: Diagnosis not present

## 2017-06-16 DIAGNOSIS — M48061 Spinal stenosis, lumbar region without neurogenic claudication: Secondary | ICD-10-CM | POA: Diagnosis not present

## 2017-06-16 DIAGNOSIS — E869 Volume depletion, unspecified: Secondary | ICD-10-CM

## 2017-06-16 DIAGNOSIS — J189 Pneumonia, unspecified organism: Principal | ICD-10-CM

## 2017-06-16 DIAGNOSIS — N281 Cyst of kidney, acquired: Secondary | ICD-10-CM | POA: Diagnosis not present

## 2017-06-16 DIAGNOSIS — E119 Type 2 diabetes mellitus without complications: Secondary | ICD-10-CM | POA: Diagnosis not present

## 2017-06-16 DIAGNOSIS — R7989 Other specified abnormal findings of blood chemistry: Secondary | ICD-10-CM

## 2017-06-16 DIAGNOSIS — R112 Nausea with vomiting, unspecified: Secondary | ICD-10-CM | POA: Diagnosis not present

## 2017-06-16 HISTORY — DX: Cyst of kidney, acquired: N28.1

## 2017-06-16 HISTORY — DX: Unspecified right bundle-branch block: I45.10

## 2017-06-16 HISTORY — DX: Spinal stenosis, lumbar region without neurogenic claudication: M48.061

## 2017-06-16 LAB — ECHOCARDIOGRAM COMPLETE
CHL CUP DOP CALC LVOT VTI: 20.4 cm
CHL CUP MV DEC (S): 306
E decel time: 306 msec
EERAT: 9.33
FS: 39 % (ref 28–44)
HEIGHTINCHES: 65 in
IV/PV OW: 1.37
LA diam end sys: 28 mm
LA vol A4C: 26.8 ml
LADIAMINDEX: 1.56 cm/m2
LASIZE: 28 mm
LAVOL: 30.2 mL
LAVOLIN: 16.9 mL/m2
LV PW d: 8.3 mm — AB (ref 0.6–1.1)
LV SIMPSON'S DISK: 60
LV TDI E'MEDIAL: 7.18
LV dias vol index: 21 mL/m2
LV dias vol: 38 mL — AB (ref 46–106)
LV sys vol index: 8 mL/m2
LV sys vol: 15 mL (ref 14–42)
LVEEAVG: 9.33
LVEEMED: 9.33
LVELAT: 7.72 cm/s
LVOT area: 3.14 cm2
LVOT diameter: 20 mm
LVOT peak grad rest: 4 mmHg
LVOTPV: 102 cm/s
LVOTSV: 64 mL
Lateral S' vel: 10.8 cm/s
MV pk A vel: 111 m/s
MVPG: 2 mmHg
MVPKEVEL: 72 m/s
Stroke v: 23 ml
TAPSE: 16.9 mm
TDI e' lateral: 7.72
WEIGHTICAEL: 2435.2 [oz_av]

## 2017-06-16 LAB — GLUCOSE, CAPILLARY
GLUCOSE-CAPILLARY: 198 mg/dL — AB (ref 65–99)
GLUCOSE-CAPILLARY: 130 mg/dL — AB (ref 65–99)
Glucose-Capillary: 108 mg/dL — ABNORMAL HIGH (ref 65–99)
Glucose-Capillary: 129 mg/dL — ABNORMAL HIGH (ref 65–99)

## 2017-06-16 LAB — BASIC METABOLIC PANEL
ANION GAP: 11 (ref 5–15)
BUN: 14 mg/dL (ref 6–20)
CALCIUM: 8.4 mg/dL — AB (ref 8.9–10.3)
CHLORIDE: 100 mmol/L — AB (ref 101–111)
CO2: 27 mmol/L (ref 22–32)
Creatinine, Ser: 0.92 mg/dL (ref 0.44–1.00)
GFR calc Af Amer: >60 mL/min (ref >60)
GFR calc non Af Amer: 57 mL/min — ABNORMAL LOW (ref >60)
GLUCOSE: 101 mg/dL — AB (ref 65–99)
POTASSIUM: 3.4 mmol/L — AB (ref 3.5–5.1)
Sodium: 138 mmol/L (ref 135–145)

## 2017-06-16 LAB — CBC
HEMATOCRIT: 38.7 % (ref 36.0–46.0)
HEMOGLOBIN: 13.1 g/dL (ref 12.0–15.0)
MCH: 29.9 pg (ref 26.0–34.0)
MCHC: 33.9 g/dL (ref 30.0–36.0)
MCV: 88.4 fL (ref 78.0–100.0)
Platelets: 319 10*3/uL (ref 150–400)
RBC: 4.38 MIL/uL (ref 3.87–5.11)
RDW: 12.7 % (ref 11.5–15.5)
WBC: 6.9 10*3/uL (ref 4.0–10.5)

## 2017-06-16 LAB — STREP PNEUMONIAE URINARY ANTIGEN: Strep Pneumo Urinary Antigen: NEGATIVE

## 2017-06-16 MED ORDER — POTASSIUM CHLORIDE CRYS ER 20 MEQ PO TBCR
20.0000 meq | EXTENDED_RELEASE_TABLET | Freq: Every day | ORAL | Status: AC
  Administered 2017-06-16: 20 meq via ORAL
  Filled 2017-06-16: qty 1

## 2017-06-16 MED ORDER — GLUCERNA SHAKE PO LIQD
237.0000 mL | Freq: Three times a day (TID) | ORAL | Status: DC
  Administered 2017-06-16: 237 mL via ORAL

## 2017-06-16 MED ORDER — METOPROLOL TARTRATE 25 MG PO TABS
25.0000 mg | ORAL_TABLET | Freq: Two times a day (BID) | ORAL | Status: DC
  Administered 2017-06-16 – 2017-06-18 (×4): 25 mg via ORAL
  Filled 2017-06-16 (×4): qty 1

## 2017-06-16 MED ORDER — MORPHINE SULFATE (PF) 4 MG/ML IV SOLN
4.0000 mg | INTRAVENOUS | Status: DC | PRN
  Administered 2017-06-16 – 2017-06-17 (×4): 4 mg via INTRAVENOUS
  Filled 2017-06-16 (×4): qty 1

## 2017-06-16 MED ORDER — GLUCERNA SHAKE PO LIQD
237.0000 mL | Freq: Two times a day (BID) | ORAL | Status: DC
  Administered 2017-06-17 – 2017-06-18 (×3): 237 mL via ORAL

## 2017-06-16 NOTE — Progress Notes (Signed)
Initial Nutrition Assessment  DOCUMENTATION CODES:  Not applicable  INTERVENTION:  Glucerna Shake po TID, each supplement provides 220 kcal and 10 grams of protein  Magic cup BID with meals, each supplement provides 290 kcal and 9 grams of protein (due to not really liking the glucerna)  Pt edentulous. Need for soft food items conveyed to ambassador  Specific food/meals requests taken  Family reports heart burn being a chronic inhibitor to PO intake and potentially may need change in ppi/H2RA to help increase intake and curb wt loss  Gave some diet education on weight maintenance, stressing calorically dense items  NUTRITION DIAGNOSIS:  Inadequate oral intake related to poor appetite, nausea, vomiting, acute illness, Trouble chewing, heartburn as evidenced by loss of 9% bw in the last year and per the families report  GOAL:  Patient will meet greater than or equal to 90% of their needs  MONITOR:  PO intake, Supplement acceptance, Labs, Weight trends  REASON FOR ASSESSMENT:  Malnutrition Screening Tool    ASSESSMENT:  83 y/o female PMHx HTN, DDD, DJD, DM2. Recently admitted to OSH for polyuria/polydipsia and diagnosed with new DM2. Has Had nausea/dry heaves after taking metformin. PCP Sent to ED for possible PE. Work up reveals suspected PNA. Admitted for management  Patient initially asleep on RD arrival. Did wake up towards end, but she seemed to be confused. As such, information obtained from two family members at bedside.   They report that the patient lives on her own, but they live next door to her and can help her with meal prep. She has had a chronic decrease in her appetite, that has been going on for much longer than her recent troubles with her BG. They note they have had trouble getting her to eat prior to taking metformin->suspect her taking metformin on empty stomach may be part of reason for nausea. She will eat only 1 piece of bread and say she had ate    Additionally, she has chronic constipation, for which she takes metamucil and occasionally a softener. She has had chronic taste loss which does not correlate with any meds/incidents. They believe this started ~6 months ago.  They pigastric pain that they believe is heart burn related; they stated she was not taking anything for this, but see nexium listed on home meds in H&P.   She did not take any vitamins, minerals or oral nutritional supplements. However, see fish oil listed on home meds list.   They believe she has lost ~25 lbss in the last year. Per chart, it appears she has lost closer to 15 lbs in almost exactly 1 year. This is not clinically significant for the timeframe.   RD gave the family some education on weight maintenance. Discussed caloric density and stressed foods such as PB and Cheese, that will not be terrible for her BG and help her maintain weight. They say she is drinking diet soda, diet tea and skim milk a lot. She had been drinking mostly regular soda and sweet tea prior to dx with DM. RD recommended switching to whole milk. This will actually be better for her BG and help maintain weight more.   Explained how appetite/thirst naturally decrease with age. It may be beneficial for the patient to start a supplement. She had been given glucerna by an Charity fundraiserN, but "wasnt crazy about it". RD will try magic cup inpatient. Recommended Boost Glucose Control outpatient.   Her Lunch tray is seen barely touched. Family states she is slightly "  out of it" and also doesn't have a good appetite right now. Pt  is edentulous (has dentures but doesn't like wearing them) and has trouble chewing. RD took note of meal items the patient would be more inclined to eat. Will also pass need for soft items to dietary.   Physical Exam: Deferred due to sleeping  Labs: BG 110-200, K: 3.4,  Meds: Glucerna TID, Insulin, Oral ABx, metformin, psyllium, omega 3s, , IVF   Recent Labs Lab 06/15/17 1427  06/16/17 0615  NA 138 138  K 3.0* 3.4*  CL 100* 100*  CO2 27 27  BUN 15 14  CREATININE 1.16* 0.92  CALCIUM 9.2 8.4*  GLUCOSE 129* 101*    Diet Order:  Diet Carb Modified Fluid consistency: Thin; Room service appropriate? Yes  Skin:  Reviewed, no issues  Last BM:  8/7  Height:  Ht Readings from Last 1 Encounters:  06/15/17 5\' 5"  (1.651 m)   Weight:  Wt Readings from Last 1 Encounters:  06/15/17 152 lb 3.2 oz (69 kg)   Wt Readings from Last 10 Encounters:  06/15/17 152 lb 3.2 oz (69 kg)  06/30/16 167 lb 15.9 oz (76.2 kg)  06/28/16 168 lb (76.2 kg)  02/05/16 168 lb (76.2 kg)  01/01/16 172 lb 3.2 oz (78.1 kg)  10/19/15 170 lb (77.1 kg)  06/15/13 172 lb (78 kg)  05/29/13 174 lb 8 oz (79.2 kg)  03/09/13 175 lb 8 oz (79.6 kg)  03/02/13 174 lb (78.9 kg)   Ideal Body Weight:  56.82 kg  BMI:  Body mass index is 25.33 kg/m.  Estimated Nutritional Needs:  Kcal:  1600-1800 kcals (23-26 kcal/kg bw) Protein:  69-83g Pro (1-1.2 g/kg bw) Fluid:  >1.7L fluid (25 ml/kg bw)  EDUCATION NEEDS:  Education needs addressed  Christophe Louis RD, LDN, CNSC Clinical Nutrition Pager: [phone] 06/16/2017 2:08 PM

## 2017-06-16 NOTE — Progress Notes (Signed)
PROGRESS NOTE    Haley Nguyen  HQI:[id number] DOB: [birth year] DOA: 06/15/2017 PCP: Smith RobertKikel, Stephen, MD    Brief Narrative:  Haley Nguyen is a 83 y.o. female with history of HTN, DDD, DJD and recently dx'd with DM2, who presented to ED with a 2 week hx of intermittent chest pain and upper abd pain. She said her upper back on both sides had been hurting; also  with occasional nausea and dry heaving. Her daughter reported that [facility] patient [facility]/ polydipsia and was dx'd with new onset DM2 with a blood glucose of > 600. She was not admitted since she was leaving for [place] the next day and because her blood glucose improved with insulin and IV fluids. She was discharged from the ED on metformin.  She had been having nausea/ dry heaves after taking metformin.  She went to PCP on the day of admission and he sent her to the ED for evaluation of possible PE.  CT angio chest showed no PE but small infiltrate upper lobe suspected PNA. She was admitted for further evaluation and management.    Assessment & Plan:   Principal Problem:   CAP (community acquired pneumonia) Active Problems:   Elevated troponin I level   Right bundle branch block   Epigastric pain   Essential hypertension   Type 2 diabetes mellitus without complication (HCC)   Nausea & vomiting   Volume depletion   Bilateral renal cysts   Spinal stenosis at L4-L5 level   Hypokalemia   Patient has some clinical improvement. Her serum potassium is better. Her elevated troponin I has been flat, but will order a 2-D echocardiogram for further evaluation. If it appears worrisome, we will consult cardiology. -Continue antibiotics, analgesics, and supportive treatment. Morphine was added as needed for pain when tramadol is not effective. -Lopressor dosing decreased due to soft blood pressures seen earlier.    DVT prophylaxis: Lovenox Code Status: Full code Family Communication:  Discussed with daughter Disposition Plan: Discharge to home, likely in a 1-2 days.   Consultants:   None  Procedures:  None  Antimicrobials:   Rocephin 8/7>>>  Doxycycline 8/7>>   Subjective: Patient says it is still hurts on the right side when she moves or tries to breathe in. She denies precordial or substernal chest pain. She has mild shortness of breath.  Objective: Vitals:   06/15/17 2030 06/15/17 2221 06/15/17 2234 06/16/17 0557  BP: (!) 134/91 140/60  95/67  Pulse: 75 72  70  Resp: 18 18  18   Temp:  97.8 F (36.6 C)  98.2 F (36.8 C)  TempSrc:  Oral  Oral  SpO2: 99% 95% 95% 100%  Weight:  69 kg (152 lb 3.2 oz)    Height:  5\' 5"  (1.651 m)      Intake/Output Summary (Last 24 hours) at 06/16/17 1157 Last data filed at 06/16/17 0854  Gross per 24 hour  Intake          2641.66 ml  Output              700 ml  Net          1941.66 ml   Filed Weights   06/15/17 1355 06/15/17 2221  Weight: 79.4 kg (175 lb) 69 kg (152 lb 3.2 oz)    Examination:  General exam: Appears calm and comfortable  Respiratory system: A few crackles on the right Respiratory effort normal.  Cardiovascular system: S1 & S2 heard, with a soft systolic murmur. No pedal edema. Gastrointestinal system: Abdomen is nondistended, soft and nontender. No organomegaly or masses felt. Normal bowel sounds heard. Central nervous system: Alert and oriented. No focal neurological deficits. Extremities/musculoskeletal: Mild tenderness over the lumbar sacral spine without edema or erythema. Skin: No rashes, lesions or ulcers Psychiatry: Judgement and insight appear normal. Mood & affect appropriate.     Data Reviewed: I have personally reviewed following labs and imaging studies  CBC:  Recent Labs Lab 06/15/17 1427 06/16/17 0615  WBC 7.9 6.9  HGB 13.8 13.1  HCT 40.9 38.7  MCV 88.1 88.4  PLT 335 319   Basic Metabolic Panel:  Recent Labs Lab 06/15/17 1427 06/16/17 0615  NA 138 138  K  3.0* 3.4*  CL 100* 100*  CO2 27 27  GLUCOSE 129* 101*  BUN 15 14  CREATININE 1.16* 0.92  CALCIUM 9.2 8.4*   GFR: Estimated Creatinine Clearance: 46.8 mL/min (by C-G formula based on SCr of 0.92 mg/dL). Liver Function Tests:  Recent Labs Lab 06/15/17 1427  AST 31  ALT 36  ALKPHOS 78  BILITOT 0.7  PROT 7.6  ALBUMIN 3.5    Recent Labs Lab 06/15/17 1427  LIPASE 26   No results for input(s): AMMONIA in the last 168 hours. Coagulation Profile: No results for input(s): INR, PROTIME in the last 168 hours. Cardiac Enzymes:  Recent Labs Lab 06/15/17 1427 06/15/17 1958  TROPONINI 0.16* 0.17*   BNP (last 3 results) No results for input(s): PROBNP in the last 8760 hours. HbA1C: No results for input(s): HGBA1C in the last 72 hours. CBG:  Recent Labs Lab 06/15/17 2248 06/16/17 0825 06/16/17 1131  GLUCAP 105* 108* 198*   Lipid Profile: No results for input(s): CHOL, HDL, LDLCALC, TRIG, CHOLHDL, LDLDIRECT in the last 72 hours. Thyroid Function Tests: No results for input(s): TSH, T4TOTAL, FREET4, T3FREE, THYROIDAB in the last 72 hours. Anemia Panel: No results for input(s): VITAMINB12, FOLATE, FERRITIN, TIBC, IRON, RETICCTPCT in the last 72 hours. Sepsis Labs: No results for input(s): PROCALCITON, LATICACIDVEN in the last 168 hours.  Recent Results (from the past 240 hour(s))  Culture, blood (routine x 2) Call MD if unable to obtain prior to antibiotics being given     Status: None (Preliminary result)   Collection Time: 06/15/17 11:38 PM  Result Value Ref Range Status   Specimen Description BLOOD LEFT ARM  Final   Special Requests   Final    BOTTLES DRAWN AEROBIC AND ANAEROBIC Blood Culture results may not be optimal due to an inadequate volume of blood received in culture bottles   Culture NO GROWTH < 12 HOURS  Final   Report Status PENDING  Incomplete  Culture, blood (routine x 2) Call MD if unable to obtain prior to antibiotics being given     Status: None  (Preliminary result)   Collection Time: 06/15/17 11:49 PM  Result Value Ref Range Status   Specimen Description BLOOD LEFT HAND  Final   Special Requests   Final    AEROBIC BOTTLE ONLY Blood Culture results may not be optimal due to an inadequate volume of blood received in culture bottles   Culture NO GROWTH < 12 HOURS  Final   Report Status PENDING  Incomplete         Radiology Studies: Dg Chest 2 View  Result Date: 06/15/2017 CLINICAL DATA:  Short of breath EXAM: CHEST  2 VIEW COMPARISON:  06/29/2016 FINDINGS: Heart size and  vascularity normal. Atherosclerotic aortic arch. Negative for infiltrate effusion or mass. Mild apical scarring bilaterally. IMPRESSION: No active cardiopulmonary disease. Electronically Signed   By: Marlan Palau M.D.   On: 06/15/2017 14:10   Ct Angio Chest Pe W/cm &/or Wo Cm  Result Date: 06/15/2017 CLINICAL DATA:  Chest pain. Epigastric pain. Nausea and loss of appetite EXAM: CT ANGIOGRAPHY CHEST CT ABDOMEN AND PELVIS WITH CONTRAST TECHNIQUE: Multidetector CT imaging of the chest was performed using the standard protocol during bolus administration of intravenous contrast. Multiplanar CT image reconstructions and MIPs were obtained to evaluate the vascular anatomy. Multidetector CT imaging of the abdomen and pelvis was performed using the standard protocol during bolus administration of intravenous contrast. CONTRAST:  80 mL Isovue 370 nonionic COMPARISON:  CT abdomen and pelvis December 24, 2015 ; pelvic ultrasound January 05, 2013 FINDINGS: CTA CHEST FINDINGS Cardiovascular: There is no demonstrable pulmonary embolus. There is no thoracic aortic aneurysm or dissection. There is atherosclerotic calcification in the proximal right common and left subclavian arteries. There are foci of calcification in the thoracic aorta. There are foci of coronary artery calcification. The pericardium is not appreciably thickened. There are areas of atherosclerotic plaque in descending  thoracic aorta. No ulceration evident. Mediastinum/Nodes: Thyroid appears unremarkable. There is no appreciable thoracic adenopathy. Lungs/Pleura: There is mild scarring in the apices. There is patchy airspace opacity in the posterior aspect of the apical segment of the right upper lobe. There is mild bibasilar atelectasis. There is no pleural effusion or pleural thickening evident. Musculoskeletal: There is degenerative change in the thoracic spine. There are no blastic or lytic bone lesions. Review of the MIP images confirms the above findings. CT ABDOMEN and PELVIS FINDINGS Hepatobiliary: No focal liver lesions are apparent. Gallbladder is mildly distended without appreciable gallbladder wall thickening. There is no appreciable biliary duct dilatation. Pancreas: No pancreatic mass or inflammatory focus. Spleen: No splenic lesions are evident. Adrenals/Urinary Tract: Adrenals appear normal bilaterally. There is a cyst arising from the anterior upper to mid left kidney measuring 1.9 x 1.9 cm. There is a cyst in the mid right kidney measuring 1.0 x 1.0 cm. There is scarring in the posterior mid to lower right kidney. There is no evident hydronephrosis on either side. There is no renal or ureteral calculus on either side. Urinary bladder is midline with wall thickness within normal limits for degree of distention. Stomach/Bowel: There are scattered sigmoid diverticula without diverticulitis. There is no bowel wall or mesenteric thickening. There is no bowel obstruction. No evident free air or portal venous air. Vascular/Lymphatic: There is atherosclerotic calcification in the aorta and iliac arteries without aneurysm. Areas of atherosclerotic plaque noted in the aorta. At the proximal right common iliac artery, there appears to be subtotal occlusion due to extensive calcification. No adenopathy is appreciable in the abdomen or pelvis. Reproductive: Uterus absent. There is a left ovarian cyst measuring 3.6 x 2.9 cm,  stable. No new pelvic mass. Other: No evident. Appendiceal region lesion. No abscess or ascites appreciable in the abdomen or pelvis. There is a small ventral hernia containing only fat. Musculoskeletal: There is degenerative change throughout the lumbar spine. There is spinal stenosis at L3-4 and L4-5 due to disc protrusion as well as bony hypertrophy. There are no blastic or lytic bone lesions. There is no intramuscular or abdominal wall lesion. Review of the MIP images confirms the above findings. IMPRESSION: CT angiogram chest: 1.  No demonstrable pulmonary embolus. 2. Atherosclerotic calcification in the aorta with areas  of plaque in the descending thoracic aorta. No hemodynamically significant obstruction or ulceration seen. No thoracic aortic aneurysm or dissection. 3. Focal area of pneumonia in the posterior aspect of the apical segment of the right upper lobe. Mild bibasilar atelectatic change. 4.  No evident adenopathy. CT abdomen and pelvis: 1. Gallbladder distended without wall thickening. The significance of this finding is uncertain. 2. Extensive aortoiliac atherosclerosis. There appears to be subtotal occlusion of the proximal right common iliac artery due to calcification. 3. No bowel obstruction. No abscess. No periappendiceal region inflammation. Scattered sigmoid diverticula without diverticulitis. 4.  No renal or ureteral calculus.  No hydronephrosis. 5.  Moderate spinal stenosis at L3-4 and L4-5, multifactorial. 6.  Small ventral hernia containing only fat. Aortic Atherosclerosis (ICD10-I70.0). Electronically Signed   By: Bretta Bang III M.D.   On: 06/15/2017 17:42   Ct Abdomen Pelvis W Contrast  Result Date: 06/15/2017 CLINICAL DATA:  Chest pain. Epigastric pain. Nausea and loss of appetite EXAM: CT ANGIOGRAPHY CHEST CT ABDOMEN AND PELVIS WITH CONTRAST TECHNIQUE: Multidetector CT imaging of the chest was performed using the standard protocol during bolus administration of intravenous  contrast. Multiplanar CT image reconstructions and MIPs were obtained to evaluate the vascular anatomy. Multidetector CT imaging of the abdomen and pelvis was performed using the standard protocol during bolus administration of intravenous contrast. CONTRAST:  80 mL Isovue 370 nonionic COMPARISON:  CT abdomen and pelvis December 24, 2015 ; pelvic ultrasound January 05, 2013 FINDINGS: CTA CHEST FINDINGS Cardiovascular: There is no demonstrable pulmonary embolus. There is no thoracic aortic aneurysm or dissection. There is atherosclerotic calcification in the proximal right common and left subclavian arteries. There are foci of calcification in the thoracic aorta. There are foci of coronary artery calcification. The pericardium is not appreciably thickened. There are areas of atherosclerotic plaque in descending thoracic aorta. No ulceration evident. Mediastinum/Nodes: Thyroid appears unremarkable. There is no appreciable thoracic adenopathy. Lungs/Pleura: There is mild scarring in the apices. There is patchy airspace opacity in the posterior aspect of the apical segment of the right upper lobe. There is mild bibasilar atelectasis. There is no pleural effusion or pleural thickening evident. Musculoskeletal: There is degenerative change in the thoracic spine. There are no blastic or lytic bone lesions. Review of the MIP images confirms the above findings. CT ABDOMEN and PELVIS FINDINGS Hepatobiliary: No focal liver lesions are apparent. Gallbladder is mildly distended without appreciable gallbladder wall thickening. There is no appreciable biliary duct dilatation. Pancreas: No pancreatic mass or inflammatory focus. Spleen: No splenic lesions are evident. Adrenals/Urinary Tract: Adrenals appear normal bilaterally. There is a cyst arising from the anterior upper to mid left kidney measuring 1.9 x 1.9 cm. There is a cyst in the mid right kidney measuring 1.0 x 1.0 cm. There is scarring in the posterior mid to lower right  kidney. There is no evident hydronephrosis on either side. There is no renal or ureteral calculus on either side. Urinary bladder is midline with wall thickness within normal limits for degree of distention. Stomach/Bowel: There are scattered sigmoid diverticula without diverticulitis. There is no bowel wall or mesenteric thickening. There is no bowel obstruction. No evident free air or portal venous air. Vascular/Lymphatic: There is atherosclerotic calcification in the aorta and iliac arteries without aneurysm. Areas of atherosclerotic plaque noted in the aorta. At the proximal right common iliac artery, there appears to be subtotal occlusion due to extensive calcification. No adenopathy is appreciable in the abdomen or pelvis. Reproductive: Uterus absent. There is  a left ovarian cyst measuring 3.6 x 2.9 cm, stable. No new pelvic mass. Other: No evident. Appendiceal region lesion. No abscess or ascites appreciable in the abdomen or pelvis. There is a small ventral hernia containing only fat. Musculoskeletal: There is degenerative change throughout the lumbar spine. There is spinal stenosis at L3-4 and L4-5 due to disc protrusion as well as bony hypertrophy. There are no blastic or lytic bone lesions. There is no intramuscular or abdominal wall lesion. Review of the MIP images confirms the above findings. IMPRESSION: CT angiogram chest: 1.  No demonstrable pulmonary embolus. 2. Atherosclerotic calcification in the aorta with areas of plaque in the descending thoracic aorta. No hemodynamically significant obstruction or ulceration seen. No thoracic aortic aneurysm or dissection. 3. Focal area of pneumonia in the posterior aspect of the apical segment of the right upper lobe. Mild bibasilar atelectatic change. 4.  No evident adenopathy. CT abdomen and pelvis: 1. Gallbladder distended without wall thickening. The significance of this finding is uncertain. 2. Extensive aortoiliac atherosclerosis. There appears to be  subtotal occlusion of the proximal right common iliac artery due to calcification. 3. No bowel obstruction. No abscess. No periappendiceal region inflammation. Scattered sigmoid diverticula without diverticulitis. 4.  No renal or ureteral calculus.  No hydronephrosis. 5.  Moderate spinal stenosis at L3-4 and L4-5, multifactorial. 6.  Small ventral hernia containing only fat. Aortic Atherosclerosis (ICD10-I70.0). Electronically Signed   By: Bretta Bang III M.D.   On: 06/15/2017 17:42        Scheduled Meds: . amLODipine  10 mg Oral Daily  . doxycycline  100 mg Oral Q12H  . enoxaparin (LOVENOX) injection  40 mg Subcutaneous Q24H  . feeding supplement (GLUCERNA SHAKE)  237 mL Oral TID BM  . insulin aspart  0-5 Units Subcutaneous QHS  . insulin aspart  0-9 Units Subcutaneous TID WC  . metFORMIN  1,000 mg Oral BID WC  . metoprolol tartrate  25 mg Oral BID  . omega-3 acid ethyl esters  1 g Oral Daily  . psyllium  1 packet Oral BID  . simvastatin  20 mg Oral QPM   Continuous Infusions: . 0.9 % NaCl with KCl 20 mEq / L Stopped (06/16/17 1149)  . cefTRIAXone (ROCEPHIN)  IV       LOS: 1 day    Time spent: 35 minutes    Elliot Cousin, MD [facility] Pager [phone]  If 7PM-7AM, please contact night-coverage [email or web address] Password [email or web address] 06/16/2017, 11:57 AM

## 2017-06-16 NOTE — Progress Notes (Signed)
*  PRELIMINARY RESULTS* Echocardiogram 2D Echocardiogram has been performed.  Stacey DrainWhite, Shirah Roseman J 06/16/2017, 2:24 PM

## 2017-06-16 NOTE — Progress Notes (Signed)
Pt ambulated approximately 500 feet in hallway with standby assist from RN. Pt tolerated well with no complaints.

## 2017-06-17 DIAGNOSIS — R748 Abnormal levels of other serum enzymes: Secondary | ICD-10-CM | POA: Diagnosis not present

## 2017-06-17 DIAGNOSIS — R112 Nausea with vomiting, unspecified: Secondary | ICD-10-CM

## 2017-06-17 DIAGNOSIS — E119 Type 2 diabetes mellitus without complications: Secondary | ICD-10-CM

## 2017-06-17 DIAGNOSIS — E876 Hypokalemia: Secondary | ICD-10-CM | POA: Diagnosis not present

## 2017-06-17 DIAGNOSIS — M48061 Spinal stenosis, lumbar region without neurogenic claudication: Secondary | ICD-10-CM

## 2017-06-17 DIAGNOSIS — J189 Pneumonia, unspecified organism: Secondary | ICD-10-CM | POA: Diagnosis not present

## 2017-06-17 LAB — BASIC METABOLIC PANEL
ANION GAP: 8 (ref 5–15)
BUN: 12 mg/dL (ref 6–20)
CALCIUM: 7.9 mg/dL — AB (ref 8.9–10.3)
CO2: 23 mmol/L (ref 22–32)
Chloride: 106 mmol/L (ref 101–111)
Creatinine, Ser: 0.68 mg/dL (ref 0.44–1.00)
GLUCOSE: 129 mg/dL — AB (ref 65–99)
Potassium: 3.9 mmol/L (ref 3.5–5.1)
SODIUM: 137 mmol/L (ref 135–145)

## 2017-06-17 LAB — URINALYSIS, ROUTINE W REFLEX MICROSCOPIC
BILIRUBIN URINE: NEGATIVE
GLUCOSE, UA: NEGATIVE mg/dL
HGB URINE DIPSTICK: NEGATIVE
KETONES UR: NEGATIVE mg/dL
Leukocytes, UA: NEGATIVE
Nitrite: NEGATIVE
PROTEIN: NEGATIVE mg/dL
Specific Gravity, Urine: 1.012 (ref 1.005–1.030)
pH: 5 (ref 5.0–8.0)

## 2017-06-17 LAB — CBC
HCT: 35.7 % — ABNORMAL LOW (ref 36.0–46.0)
Hemoglobin: 11.9 g/dL — ABNORMAL LOW (ref 12.0–15.0)
MCH: 29.6 pg (ref 26.0–34.0)
MCHC: 33.3 g/dL (ref 30.0–36.0)
MCV: 88.8 fL (ref 78.0–100.0)
PLATELETS: 310 10*3/uL (ref 150–400)
RBC: 4.02 MIL/uL (ref 3.87–5.11)
RDW: 12.6 % (ref 11.5–15.5)
WBC: 5.5 10*3/uL (ref 4.0–10.5)

## 2017-06-17 LAB — GLUCOSE, CAPILLARY
GLUCOSE-CAPILLARY: 125 mg/dL — AB (ref 65–99)
GLUCOSE-CAPILLARY: 149 mg/dL — AB (ref 65–99)
GLUCOSE-CAPILLARY: 132 mg/dL — AB (ref 65–99)

## 2017-06-17 LAB — HIV ANTIBODY (ROUTINE TESTING W REFLEX): HIV Screen 4th Generation wRfx: NONREACTIVE

## 2017-06-17 MED ORDER — FAMOTIDINE IN NACL 20-0.9 MG/50ML-% IV SOLN
20.0000 mg | Freq: Once | INTRAVENOUS | Status: AC
  Administered 2017-06-17: 20 mg via INTRAVENOUS
  Filled 2017-06-17: qty 50

## 2017-06-17 MED ORDER — FAMOTIDINE 20 MG PO TABS
20.0000 mg | ORAL_TABLET | Freq: Every day | ORAL | Status: DC
  Administered 2017-06-18: 20 mg via ORAL
  Filled 2017-06-17: qty 1

## 2017-06-17 MED ORDER — ALUM & MAG HYDROXIDE-SIMETH 200-200-20 MG/5ML PO SUSP: 15.0000 mL | Freq: Four times a day (QID) | ORAL | Status: DC | PRN

## 2017-06-17 MED ORDER — ONDANSETRON HCL 4 MG/2ML IJ SOLN
4.0000 mg | Freq: Four times a day (QID) | INTRAMUSCULAR | Status: DC | PRN
  Administered 2017-06-17: 4 mg via INTRAVENOUS
  Filled 2017-06-17: qty 2

## 2017-06-17 NOTE — Care Management Note (Signed)
Case Management Note  Patient Details  Name: Haley Nguyen MRN: [id number] Date of Birth: Aug 03, 1933  Subjective/Objective:                  Pt is admitted with CAP. She is from home, very ind at baseline, carries in her own wood for heating. She has a walking stick she uses sometimes. She has chronic lower back pain and is now having upper back pain, she presumes from the pneumonia. Family at bedside. Has multiple family members who are very active with her care. Recently dx with diabetes, checks sugars often throughout the day and is controlled with PO meds. Family reports she has declined since being given this Dx. CM discussed HH RN for added support and maybe RW in case she requires needed support with ambulation at DC. Granddaugter at bedside will discuss with other family and will decide tomorrow.   Action/Plan: CM will follow.   Expected Discharge Date:      06/18/2017            Expected Discharge Plan:    home with self care [facility]  In-House Referral:  NA  Discharge planning Services  CM Consult  Status of Service:  In process, will continue to follow  Malcolm MetroChildress, Chanse Kagel Demske, RN 06/17/2017, 3:38 PM

## 2017-06-17 NOTE — Progress Notes (Signed)
PROGRESS NOTE    Haley Nguyen  JYN:[id number] DOB: 02-Dec-1932 DOA: 06/15/2017 PCP: Smith Robert, MD    Brief Narrative:  Haley Nguyen is a 83 y.o. female with history of HTN, DDD, DJD and recently dx'd with DM2, who presented to ED with a 2 week hx of intermittent chest pain and upper abd pain. She said her upper back on both sides had been hurting; also  with occasional nausea and dry heaving. Her daughter reported that the patient had gone to the  hospital in [facility] 2 wks ago for polyuria/ polydipsia and was dx'd with new onset DM2 with a blood glucose of > 600. She was not admitted since she was leaving for [place] the next day and because her blood glucose improved with insulin and IV fluids. She was discharged from the ED on metformin.  She had been having nausea/dry heaves after taking metformin.  She went to PCP on the day of admission and he sent her to the ED for evaluation of possible PE.  CT angio chest showed no PE but small infiltrate upper lobe suspected PNA. She was admitted for further evaluation and management.    Assessment & Plan:   Principal Problem:   CAP (community acquired pneumonia) Active Problems:   Elevated troponin I level   Right bundle branch block   Essential hypertension   Type 2 diabetes mellitus without complication (HCC)   Nausea & vomiting   Volume depletion   Bilateral renal cysts   Spinal stenosis at L4-L5 level   Hypokalemia   1. Chest pain, pleuritic. Patient reported pleuritic chest pain radiating to the lower back right greater than left. CT angiogram of her chest revealed no evidence of PE, but with right upper lobe infiltrate. -Patient denies precordial or substernal chest pain. -Cardiac enzymes were ordered for evaluation. It was 0.16 on admission and has remained flat. Per chart review, the patient has a mildly chronically elevated troponin I. She also has a chronic right bundle branch block. She has a cardiologist who follows her in  [place]. -2-D echocardiogram ordered for evaluation and revealed an EF of 60-65%, normal wall motion, and grade 1 diastolic dysfunction. -Her pain is being treated with tramadol and IV morphine as needed.  Community-acquired pneumonia. CT angiogram revealed a focal area of pneumonia in the right upper lobe. -Patient was started on Rocephin and azithromycin. Supportive treatment with oxygen also started. -Patient appears to be improving clinically and symptomatically, except for some pleurisy.  Essential hypertension. Patient is treated chronically with amlodipine and metoprolol. -Her blood pressure has been on the low end of normal, so metoprolol dosing was decreased to 25 mg twice a day. She was maintained on amlodipine.  Type 2 diabetes mellitus. Patient was recently diagnosed with type 2 diabetes. She was started on metformin several weeks ago. -She did have some initial GI symptoms, but they appear to have subsided some. -Metformin restarted on admission along with sliding scale NovoLog. Because her blood glucose is trending down, will hold the metformin.  Episode of nausea/vomiting. Patient had an episode of emesis following breakfast this morning. She denied abdominal pain. There was no evidence of coffee grounds emesis or bright red blood in her emesis per observation. -Patient was given IV Pepcid 1 and started on as needed IV Zofran. Mylanta ordered as needed. -In light of the high-dose metformin, it will be discontinued as her blood glucose has improved. -If her N/V does not resolve, we'll consider ordering other studies.  Low back pain. Patient was noted to have lumbar spinal stenosis on the abdominal CT. -We'll continue tramadol and as needed IV morphine ordered. -We'll consult physical therapy.  Hypokalemia. Patient's serum potassium was 3.4. Potassium chloride supplement started. Her serum potassium has improved.    DVT prophylaxis: Lovenox Code Status: Full  code Family Communication: Discussed with daughter Disposition Plan: Discharge to home, likely in a 1-2 days.   Consultants:   None  Procedures:  None  Antimicrobials:   Rocephin 8/7>>>  Doxycycline 8/7>>   Subjective: Patient says it is still hurts on the right side when she moves or tries to breathe in. She denies precordial or substernal chest pain. She has mild shortness of breath.  Objective: Vitals:   06/16/17 2140 06/17/17 0558 06/17/17 1019 06/17/17 1416  BP: (!) 141/51 (!) 160/64 (!) 139/50 (!) 125/49  Pulse: 88 80 75 71  Resp: 20 17  16   Temp: 98.5 F (36.9 C) 98.5 F (36.9 C)  97.9 F (36.6 C)  TempSrc: Oral Oral  Oral  SpO2: 98% 97%  94%  Weight:      Height:        Intake/Output Summary (Last 24 hours) at 06/17/17 1537 Last data filed at 06/17/17 0900  Gross per 24 hour  Intake          1739.33 ml  Output             1000 ml  Net           739.33 ml   Filed Weights   06/15/17 1355 06/15/17 2221  Weight: 79.4 kg (175 lb) 69 kg (152 lb 3.2 oz)    Examination:  General exam: Appears calm and comfortable  Respiratory system: A few crackles on the right Respiratory effort normal. Cardiovascular system: S1 & S2 heard, with a soft systolic murmur. No pedal edema. Gastrointestinal system: Abdomen is nondistended, soft and nontender. No organomegaly or masses felt. Normal bowel sounds heard. Central nervous system: Alert and oriented. No focal neurological deficits. Extremities/musculoskeletal: Mild tenderness over the lumbar sacral spine without edema or erythema. Skin: No rashes, lesions or ulcers Psychiatry: Judgement and insight appear normal. Mood & affect appropriate.     Data Reviewed: I have personally reviewed following labs and imaging studies  CBC:  Recent Labs Lab 06/15/17 1427 06/16/17 0615 06/17/17 0405  WBC 7.9 6.9 5.5  HGB 13.8 13.1 11.9*  HCT 40.9 38.7 35.7*  MCV 88.1 88.4 88.8  PLT 335 319 310   Basic Metabolic  Panel:  Recent Labs Lab 06/15/17 1427 06/16/17 0615 06/17/17 0405  NA 138 138 137  K 3.0* 3.4* 3.9  CL 100* 100* 106  CO2 27 27 23   GLUCOSE 129* 101* 129*  BUN 15 14 12   CREATININE 1.16* 0.92 0.68  CALCIUM 9.2 8.4* 7.9*   GFR: Estimated Creatinine Clearance: 53.8 mL/min (by C-G formula based on SCr of 0.68 mg/dL). Liver Function Tests:  Recent Labs Lab 06/15/17 1427  AST 31  ALT 36  ALKPHOS 78  BILITOT 0.7  PROT 7.6  ALBUMIN 3.5    Recent Labs Lab 06/15/17 1427  LIPASE 26   No results for input(s): AMMONIA in the last 168 hours. Coagulation Profile: No results for input(s): INR, PROTIME in the last 168 hours. Cardiac Enzymes:  Recent Labs Lab 06/15/17 1427 06/15/17 1958  TROPONINI 0.16* 0.17*   BNP (last 3 results) No results for input(s): PROBNP in the last 8760 hours. HbA1C: No results for input(s): HGBA1C  in the last 72 hours. CBG:  Recent Labs Lab 06/16/17 1131 06/16/17 1715 06/16/17 2139 06/17/17 0758 06/17/17 1115  GLUCAP 198* 130* 129* 125* 149*   Lipid Profile: No results for input(s): CHOL, HDL, LDLCALC, TRIG, CHOLHDL, LDLDIRECT in the last 72 hours. Thyroid Function Tests: No results for input(s): TSH, T4TOTAL, FREET4, T3FREE, THYROIDAB in the last 72 hours. Anemia Panel: No results for input(s): VITAMINB12, FOLATE, FERRITIN, TIBC, IRON, RETICCTPCT in the last 72 hours. Sepsis Labs: No results for input(s): PROCALCITON, LATICACIDVEN in the last 168 hours.  Recent Results (from the past 240 hour(s))  Culture, blood (routine x 2) Call MD if unable to obtain prior to antibiotics being given     Status: None (Preliminary result)   Collection Time: 06/15/17 11:38 PM  Result Value Ref Range Status   Specimen Description BLOOD LEFT ARM  Final   Special Requests   Final    BOTTLES DRAWN AEROBIC AND ANAEROBIC Blood Culture results may not be optimal due to an inadequate volume of blood received in culture bottles   Culture NO GROWTH 1 DAY   Final   Report Status PENDING  Incomplete  Culture, blood (routine x 2) Call MD if unable to obtain prior to antibiotics being given     Status: None (Preliminary result)   Collection Time: 06/15/17 11:49 PM  Result Value Ref Range Status   Specimen Description BLOOD LEFT HAND  Final   Special Requests   Final    AEROBIC BOTTLE ONLY Blood Culture results may not be optimal due to an inadequate volume of blood received in culture bottles   Culture NO GROWTH 1 DAY  Final   Report Status PENDING  Incomplete         Radiology Studies: Ct Angio Chest Pe W/cm &/or Wo Cm  Result Date: 06/15/2017 CLINICAL DATA:  Chest pain. Epigastric pain. Nausea and loss of appetite EXAM: CT ANGIOGRAPHY CHEST CT ABDOMEN AND PELVIS WITH CONTRAST TECHNIQUE: Multidetector CT imaging of the chest was performed using the standard protocol during bolus administration of intravenous contrast. Multiplanar CT image reconstructions and MIPs were obtained to evaluate the vascular anatomy. Multidetector CT imaging of the abdomen and pelvis was performed using the standard protocol during bolus administration of intravenous contrast. CONTRAST:  80 mL Isovue 370 nonionic COMPARISON:  CT abdomen and pelvis December 24, 2015 ; pelvic ultrasound January 05, 2013 FINDINGS: CTA CHEST FINDINGS Cardiovascular: There is no demonstrable pulmonary embolus. There is no thoracic aortic aneurysm or dissection. There is atherosclerotic calcification in the proximal right common and left subclavian arteries. There are foci of calcification in the thoracic aorta. There are foci of coronary artery calcification. The pericardium is not appreciably thickened. There are areas of atherosclerotic plaque in descending thoracic aorta. No ulceration evident. Mediastinum/Nodes: Thyroid appears unremarkable. There is no appreciable thoracic adenopathy. Lungs/Pleura: There is mild scarring in the apices. There is patchy airspace opacity in the posterior aspect  of the apical segment of the right upper lobe. There is mild bibasilar atelectasis. There is no pleural effusion or pleural thickening evident. Musculoskeletal: There is degenerative change in the thoracic spine. There are no blastic or lytic bone lesions. Review of the MIP images confirms the above findings. CT ABDOMEN and PELVIS FINDINGS Hepatobiliary: No focal liver lesions are apparent. Gallbladder is mildly distended without appreciable gallbladder wall thickening. There is no appreciable biliary duct dilatation. Pancreas: No pancreatic mass or inflammatory focus. Spleen: No splenic lesions are evident. Adrenals/Urinary Tract: Adrenals appear normal  bilaterally. There is a cyst arising from the anterior upper to mid left kidney measuring 1.9 x 1.9 cm. There is a cyst in the mid right kidney measuring 1.0 x 1.0 cm. There is scarring in the posterior mid to lower right kidney. There is no evident hydronephrosis on either side. There is no renal or ureteral calculus on either side. Urinary bladder is midline with wall thickness within normal limits for degree of distention. Stomach/Bowel: There are scattered sigmoid diverticula without diverticulitis. There is no bowel wall or mesenteric thickening. There is no bowel obstruction. No evident free air or portal venous air. Vascular/Lymphatic: There is atherosclerotic calcification in the aorta and iliac arteries without aneurysm. Areas of atherosclerotic plaque noted in the aorta. At the proximal right common iliac artery, there appears to be subtotal occlusion due to extensive calcification. No adenopathy is appreciable in the abdomen or pelvis. Reproductive: Uterus absent. There is a left ovarian cyst measuring 3.6 x 2.9 cm, stable. No new pelvic mass. Other: No evident. Appendiceal region lesion. No abscess or ascites appreciable in the abdomen or pelvis. There is a small ventral hernia containing only fat. Musculoskeletal: There is degenerative change throughout  the lumbar spine. There is spinal stenosis at L3-4 and L4-5 due to disc protrusion as well as bony hypertrophy. There are no blastic or lytic bone lesions. There is no intramuscular or abdominal wall lesion. Review of the MIP images confirms the above findings. IMPRESSION: CT angiogram chest: 1.  No demonstrable pulmonary embolus. 2. Atherosclerotic calcification in the aorta with areas of plaque in the descending thoracic aorta. No hemodynamically significant obstruction or ulceration seen. No thoracic aortic aneurysm or dissection. 3. Focal area of pneumonia in the posterior aspect of the apical segment of the right upper lobe. Mild bibasilar atelectatic change. 4.  No evident adenopathy. CT abdomen and pelvis: 1. Gallbladder distended without wall thickening. The significance of this finding is uncertain. 2. Extensive aortoiliac atherosclerosis. There appears to be subtotal occlusion of the proximal right common iliac artery due to calcification. 3. No bowel obstruction. No abscess. No periappendiceal region inflammation. Scattered sigmoid diverticula without diverticulitis. 4.  No renal or ureteral calculus.  No hydronephrosis. 5.  Moderate spinal stenosis at L3-4 and L4-5, multifactorial. 6.  Small ventral hernia containing only fat. Aortic Atherosclerosis (ICD10-I70.0). Electronically Signed   By: Bretta Bang III M.D.   On: 06/15/2017 17:42   Ct Abdomen Pelvis W Contrast  Result Date: 06/15/2017 CLINICAL DATA:  Chest pain. Epigastric pain. Nausea and loss of appetite EXAM: CT ANGIOGRAPHY CHEST CT ABDOMEN AND PELVIS WITH CONTRAST TECHNIQUE: Multidetector CT imaging of the chest was performed using the standard protocol during bolus administration of intravenous contrast. Multiplanar CT image reconstructions and MIPs were obtained to evaluate the vascular anatomy. Multidetector CT imaging of the abdomen and pelvis was performed using the standard protocol during bolus administration of intravenous  contrast. CONTRAST:  80 mL Isovue 370 nonionic COMPARISON:  CT abdomen and pelvis December 24, 2015 ; pelvic ultrasound January 05, 2013 FINDINGS: CTA CHEST FINDINGS Cardiovascular: There is no demonstrable pulmonary embolus. There is no thoracic aortic aneurysm or dissection. There is atherosclerotic calcification in the proximal right common and left subclavian arteries. There are foci of calcification in the thoracic aorta. There are foci of coronary artery calcification. The pericardium is not appreciably thickened. There are areas of atherosclerotic plaque in descending thoracic aorta. No ulceration evident. Mediastinum/Nodes: Thyroid appears unremarkable. There is no appreciable thoracic adenopathy. Lungs/Pleura: There is mild  scarring in the apices. There is patchy airspace opacity in the posterior aspect of the apical segment of the right upper lobe. There is mild bibasilar atelectasis. There is no pleural effusion or pleural thickening evident. Musculoskeletal: There is degenerative change in the thoracic spine. There are no blastic or lytic bone lesions. Review of the MIP images confirms the above findings. CT ABDOMEN and PELVIS FINDINGS Hepatobiliary: No focal liver lesions are apparent. Gallbladder is mildly distended without appreciable gallbladder wall thickening. There is no appreciable biliary duct dilatation. Pancreas: No pancreatic mass or inflammatory focus. Spleen: No splenic lesions are evident. Adrenals/Urinary Tract: Adrenals appear normal bilaterally. There is a cyst arising from the anterior upper to mid left kidney measuring 1.9 x 1.9 cm. There is a cyst in the mid right kidney measuring 1.0 x 1.0 cm. There is scarring in the posterior mid to lower right kidney. There is no evident hydronephrosis on either side. There is no renal or ureteral calculus on either side. Urinary bladder is midline with wall thickness within normal limits for degree of distention. Stomach/Bowel: There are  scattered sigmoid diverticula without diverticulitis. There is no bowel wall or mesenteric thickening. There is no bowel obstruction. No evident free air or portal venous air. Vascular/Lymphatic: There is atherosclerotic calcification in the aorta and iliac arteries without aneurysm. Areas of atherosclerotic plaque noted in the aorta. At the proximal right common iliac artery, there appears to be subtotal occlusion due to extensive calcification. No adenopathy is appreciable in the abdomen or pelvis. Reproductive: Uterus absent. There is a left ovarian cyst measuring 3.6 x 2.9 cm, stable. No new pelvic mass. Other: No evident. Appendiceal region lesion. No abscess or ascites appreciable in the abdomen or pelvis. There is a small ventral hernia containing only fat. Musculoskeletal: There is degenerative change throughout the lumbar spine. There is spinal stenosis at L3-4 and L4-5 due to disc protrusion as well as bony hypertrophy. There are no blastic or lytic bone lesions. There is no intramuscular or abdominal wall lesion. Review of the MIP images confirms the above findings. IMPRESSION: CT angiogram chest: 1.  No demonstrable pulmonary embolus. 2. Atherosclerotic calcification in the aorta with areas of plaque in the descending thoracic aorta. No hemodynamically significant obstruction or ulceration seen. No thoracic aortic aneurysm or dissection. 3. Focal area of pneumonia in the posterior aspect of the apical segment of the right upper lobe. Mild bibasilar atelectatic change. 4.  No evident adenopathy. CT abdomen and pelvis: 1. Gallbladder distended without wall thickening. The significance of this finding is uncertain. 2. Extensive aortoiliac atherosclerosis. There appears to be subtotal occlusion of the proximal right common iliac artery due to calcification. 3. No bowel obstruction. No abscess. No periappendiceal region inflammation. Scattered sigmoid diverticula without diverticulitis. 4.  No renal or  ureteral calculus.  No hydronephrosis. 5.  Moderate spinal stenosis at L3-4 and L4-5, multifactorial. 6.  Small ventral hernia containing only fat. Aortic Atherosclerosis (ICD10-I70.0). Electronically Signed   By: Bretta BangWilliam  Woodruff III M.D.   On: 06/15/2017 17:42        Scheduled Meds: . amLODipine  10 mg Oral Daily  . doxycycline  100 mg Oral Q12H  . enoxaparin (LOVENOX) injection  40 mg Subcutaneous Q24H  . feeding supplement (GLUCERNA SHAKE)  237 mL Oral BID BM  . insulin aspart  0-5 Units Subcutaneous QHS  . insulin aspart  0-9 Units Subcutaneous TID WC  . metFORMIN  1,000 mg Oral BID WC  . metoprolol tartrate  25  mg Oral BID  . omega-3 acid ethyl esters  1 g Oral Daily  . psyllium  1 packet Oral BID  . simvastatin  20 mg Oral QPM   Continuous Infusions: . 0.9 % NaCl with KCl 20 mEq / L 70 mL/hr at 06/17/17 1100  . cefTRIAXone (ROCEPHIN)  IV Stopped (06/16/17 1825)     LOS: 2 days    Time spent: 35 minutes    Elliot Cousin, MD [facility] Pager [phone]  If 7PM-7AM, please contact night-coverage [email or web address] Password [facility] 06/17/2017, 3:37 PM

## 2017-06-18 DIAGNOSIS — J189 Pneumonia, unspecified organism: Secondary | ICD-10-CM | POA: Diagnosis not present

## 2017-06-18 DIAGNOSIS — I10 Essential (primary) hypertension: Secondary | ICD-10-CM

## 2017-06-18 DIAGNOSIS — N281 Cyst of kidney, acquired: Secondary | ICD-10-CM | POA: Diagnosis not present

## 2017-06-18 DIAGNOSIS — R748 Abnormal levels of other serum enzymes: Secondary | ICD-10-CM | POA: Diagnosis not present

## 2017-06-18 LAB — GLUCOSE, CAPILLARY
GLUCOSE-CAPILLARY: 119 mg/dL — AB (ref 65–99)
GLUCOSE-CAPILLARY: 120 mg/dL — AB (ref 65–99)
GLUCOSE-CAPILLARY: 171 mg/dL — AB (ref 65–99)

## 2017-06-18 MED ORDER — GLIPIZIDE 5 MG PO TABS: 2.5000 mg | ORAL_TABLET | Freq: Two times a day (BID) | ORAL | 2 refills | Status: DC

## 2017-06-18 MED ORDER — FAMOTIDINE 20 MG PO TABS: 20.0000 mg | ORAL_TABLET | Freq: Every day | ORAL | Status: DC

## 2017-06-18 MED ORDER — ONDANSETRON HCL 4 MG PO TABS: 4.0000 mg | ORAL_TABLET | Freq: Three times a day (TID) | ORAL | 0 refills | Status: DC | PRN

## 2017-06-18 MED ORDER — DOXYCYCLINE HYCLATE 100 MG PO TABS: 100.0000 mg | ORAL_TABLET | Freq: Two times a day (BID) | ORAL | 0 refills | Status: DC

## 2017-06-18 NOTE — Care Management Note (Signed)
Case Management Note  Patient Details  Name: Haley Nguyen MRN: [id number] Date of Birth: 05/10/1933  Expected Discharge Date:  06/18/17               Expected Discharge Plan:  Home/Self Care  In-House Referral:  NA  Discharge planning Services  CM Consult  Post Acute Care Choice:  NA Choice offered to:  NA  Status of Service:  Completed, signed off  Additional Comments: Pt discharging home today with self/family care. Pt/family wants no HH or DME prior to DC. They communicate no needs prior to DC.   Malcolm Metrohildress, Karlina Suares Demske, RN 06/18/2017, 1:19 PM

## 2017-06-18 NOTE — Care Management Important Message (Signed)
Important Message  Patient Details  Name: Haley Nguyen MRN: [id number] Date of Birth: [birth year]   Medicare Important Message Given:  Yes    Malcolm MetroChildress, Courtland Reas Demske, RN 06/18/2017, 1:17 PM

## 2017-06-18 NOTE — Discharge Summary (Addendum)
Physician Discharge Summary  IVEY NEMBHARD ZOX:[id number] DOB: [birth year] DOA: 06/15/2017  PCP: Smith Robert, MD  Admit date: 06/15/2017 Discharge date: 06/18/2017  Time spent: Greater than 30 minutes  Recommendations for Outpatient Follow-up:  1. Metformin was discontinued due to GI symptomatology. Glipizide was started which can be titrated accordingly per the discretion of her PCP.  2. Recommend follow-up of the patient's troponin I and cardiac status. Family reports that she has a follow-up appointment with her cardiologist on 06/28/16 in [place].    Discharge Diagnoses:  1. Pleuritic chest pain secondary to community-acquired pneumonia. 2. Community-acquired pneumonia. 3. Essential hypertension. 4. Elevated troponin I, which has been chronic and not indicative of ACS. 5. Chronic right bundle branch block. 6. Hypokalemia. 7. Mild acute kidney injury secondary to prerenal azotemia. 8. Recent diagnosis of type 2 diabetes mellitus. 9. Nausea and vomiting, thought to be secondary to metformin. 10. Low back pain with radiographic evidence of L4-L5 spinal stenosis. 11. Radiographic evidence of bilateral renal cysts.  Discharge Condition: Improved  Diet recommendation: Carbohydrate modified/heart healthy  Filed Weights   06/15/17 1355 06/15/17 2221  Weight: 79.4 kg (175 lb) 69 kg (152 lb 3.2 oz)    History of present illness:  Haley Nguyen a 83 y.o.femalewith history of HTN, DDD, DJD and recently dx'd with DM2, who presented to ED with a 2 week hx of intermittent chest pain and upper abd pain. She said her upper back on both sides had been hurting; also  with occasional nausea and dry heaving. Her daughter reported that the patient had gone to the hospital in [facility] 2 wks ago for polyuria/ polydipsia and was dx'd with new onset DM2 with a blood glucose of >600. She was not admitted since she was leaving for [place] the next day and because her blood glucose improved with  insulin and IV fluids. She was discharged from the ED on metformin. She had been having nausea/dry heaves after taking metformin. She went to PCP on the day of admission and he sent her to the ED for evaluation of possible PE. CT angio chest showed no PE but small infiltrate upper lobe suspected PNA. She was admitted for further evaluation and management.   Hospital Course:   1. Chest pain, pleuritic. Patient reported pleuritic chest pain radiating to the lower back right greater than left. CT angiogram of her chest revealed no evidence of PE, but with right upper lobe infiltrate. -Patient denied precordial or substernal chest pain. -Cardiac enzymes were ordered for evaluation. It was 0.16 on admission and had remained flat. Per chart review, the patient has a mildly chronically elevated troponin I. She also has a chronic right bundle branch block. She has a cardiologist who follows her in [place]. -2-D echocardiogram ordered for evaluation and revealed an EF of 60-65%, normal wall motion, and grade 1 diastolic dysfunction. -Her chest pain resolved.  Community-acquired pneumonia. CT angiogram revealed a focal area of pneumonia in the right upper lobe. -Patient was started on Rocephin and azithromycin. Supportive treatment with oxygen also started. -Patient improved clinically and symptomatically. She was discharged on a oral doxycycline to complete a seven-day course.  Essential hypertension. Patient is treated chronically with amlodipine and metoprolol. -Her blood pressure was on the low end of normal, so metoprolol dosing was decreased to 25 mg twice a day. She was maintained on amlodipine.  -At the time of hospital discharge, her blood pressure improved and she was instructed to take metoprolol and amlodipine at the prehospital  doses.  Type 2 diabetes mellitus. Patient was recently diagnosed with type 2 diabetes. She was started on metformin several weeks ago. Apparently, it was  titrated up to 1000 mg twice a day. -She reported nausea and dry heaving this following the start of metformin. She had about of nausea and vomiting in the hospital after taking metformin with breakfast. -Metformin was discontinued and sliding-scale NovoLog was started in the hospital. -Her CBGs were well controlled during hospitalization. -Due to her age and GI symptomatology and possibly metformin-induced anorexia, it was felt that metformin was not the optimal medication for her. She did not want to be started on insulin at home. -Therefore, glipizide 2.5 mg twice a day was prescribed at the time of discharge. She was instructed to not take glipizide if her CBG was less than 125. Glipizide can be titrated per the discretion of her PCP.  Episode of nausea/vomiting. Patient had an episode of emesis following breakfast. She denied abdominal pain. There was no evidence of coffee grounds emesis or bright red blood in her emesis per observation. -Patient was given IV Pepcid 1 and started on as needed IV Zofran. Mylanta ordered as needed. -In light of the high-dose metformin, it will be discontinued as her blood glucose had improved.  Mild acute kidney injury secondary to prerenal azotemia. Patient's creatinine was 1.16 on admission. -Following IV fluid hydration, her creatinine normalized.  Low back pain. Patient was noted to have an incidental finding of lumbar spinal stenosis on the abdominal CT. -She was restarted on as needed tramadol. IV morphine as needed was also ordered. -She was evaluated by the physical therapist. She ambulated well with very little assistance.  Hypokalemia. Patient's serum potassium was 3.4. Potassium chloride supplement was given. Her serum potassium improved.     Procedures: 2-D echocardiogram on 06/16/17: Study Conclusions - Left ventricle: The cavity size was normal. Wall thickness was   increased in a pattern of mild LVH. Systolic function was normal.    The estimated ejection fraction was in the range of 60% to 65%.   Wall motion was normal; there were no regional wall motion   abnormalities. Doppler parameters are consistent with abnormal   left ventricular relaxation (grade 1 diastolic dysfunction). - Aortic valve: Valve area (VTI): 3.14 cm^2. Valve area (Vmax):   3.08 cm^2. Valve area (Vmean): 2.81 cm^2. - Technically adequate study.  Consultations:  None  Discharge Exam: Vitals:   06/17/17 2100 06/18/17 0700  BP: (!) 142/51 135/76  Pulse: 75 75  Resp: 18 17  Temp: 99 F (37.2 C) 98.7 F (37.1 C)  SpO2: 94% 94%    General: Patient is smiling, ambulating, and stating that "I'm ready to go home". Cardiovascular: S1, S2, with a soft systolic murmur. Respiratory: Mostly clear with occasional crackles on the right; breathing nonlabored.  Discharge Instructions   Discharge Instructions    Diet - low sodium heart healthy    Complete by:  As directed    Discharge instructions    Complete by:  As directed    Check your blood sugar at least twice daily. Did not take glipizide if your blood sugar is less than 125.   Increase activity slowly    Complete by:  As directed      Current Discharge Medication List    START taking these medications   Details  doxycycline (VIBRA-TABS) 100 MG tablet Take 1 tablet (100 mg total) by mouth every 12 (twelve) hours. Antibiotic to be taken for 4 more  days. Rob BuntingQty: 8 tablet, Refills: 0    famotidine (PEPCID) 20 MG tablet Take 1 tablet (20 mg total) by mouth daily.    glipiZIDE (GLUCOTROL) 5 MG tablet Take 0.5 tablets (2.5 mg total) by mouth 2 (two) times daily before a meal. Do not take this medication for diabetes if your blood sugar is less than 125. Qty: 30 tablet, Refills: 2    ondansetron (ZOFRAN) 4 MG tablet Take 1 tablet (4 mg total) by mouth every 8 (eight) hours as needed for nausea or vomiting. Qty: 20 tablet, Refills: 0      CONTINUE these medications which have NOT CHANGED    Details  amLODipine (NORVASC) 10 MG tablet Take 10 mg by mouth every morning.     diphenhydrAMINE (BENADRYL) 25 mg capsule Take 25 mg by mouth every 6 (six) hours as needed for allergies.    docusate sodium (COLACE) 100 MG capsule Take 200 mg by mouth 2 (two) times daily as needed for mild constipation or moderate constipation.     metoprolol (LOPRESSOR) 50 MG tablet Take 50 mg by mouth 2 (two) times daily.    Omega-3 Fatty Acids (FISH OIL) 1000 MG CAPS Take 1 capsule by mouth daily.    Psyllium (FIBER) 0.52 g CAPS Take 2 capsules by mouth 2 (two) times daily.    simvastatin (ZOCOR) 40 MG tablet Take 20 mg by mouth every evening.    traMADol (ULTRAM) 50 MG tablet Take 50 mg by mouth every 6 (six) hours as needed for pain.    clotrimazole (MYCELEX) 10 MG troche Take 10 mg by mouth 5 (five) times daily. 5 day course starting on 06/04/2017      STOP taking these medications     metFORMIN (GLUCOPHAGE) 1000 MG tablet        No Known Allergies    The results of significant diagnostics from this hospitalization (including imaging, microbiology, ancillary and laboratory) are listed below for reference.    Significant Diagnostic Studies: Dg Chest 2 View  Result Date: 06/15/2017 CLINICAL DATA:  Short of breath EXAM: CHEST  2 VIEW COMPARISON:  06/29/2016 FINDINGS: Heart size and vascularity normal. Atherosclerotic aortic arch. Negative for infiltrate effusion or mass. Mild apical scarring bilaterally. IMPRESSION: No active cardiopulmonary disease. Electronically Signed   By: Marlan Palauharles  Clark M.D.   On: 06/15/2017 14:10   Ct Angio Chest Pe W/cm &/or Wo Cm  Result Date: 06/15/2017 CLINICAL DATA:  Chest pain. Epigastric pain. Nausea and loss of appetite EXAM: CT ANGIOGRAPHY CHEST CT ABDOMEN AND PELVIS WITH CONTRAST TECHNIQUE: Multidetector CT imaging of the chest was performed using the standard protocol during bolus administration of intravenous contrast. Multiplanar CT image reconstructions  and MIPs were obtained to evaluate the vascular anatomy. Multidetector CT imaging of the abdomen and pelvis was performed using the standard protocol during bolus administration of intravenous contrast. CONTRAST:  80 mL Isovue 370 nonionic COMPARISON:  CT abdomen and pelvis December 24, 2015 ; pelvic ultrasound January 05, 2013 FINDINGS: CTA CHEST FINDINGS Cardiovascular: There is no demonstrable pulmonary embolus. There is no thoracic aortic aneurysm or dissection. There is atherosclerotic calcification in the proximal right common and left subclavian arteries. There are foci of calcification in the thoracic aorta. There are foci of coronary artery calcification. The pericardium is not appreciably thickened. There are areas of atherosclerotic plaque in descending thoracic aorta. No ulceration evident. Mediastinum/Nodes: Thyroid appears unremarkable. There is no appreciable thoracic adenopathy. Lungs/Pleura: There is mild scarring in the apices. There is patchy  airspace opacity in the posterior aspect of the apical segment of the right upper lobe. There is mild bibasilar atelectasis. There is no pleural effusion or pleural thickening evident. Musculoskeletal: There is degenerative change in the thoracic spine. There are no blastic or lytic bone lesions. Review of the MIP images confirms the above findings. CT ABDOMEN and PELVIS FINDINGS Hepatobiliary: No focal liver lesions are apparent. Gallbladder is mildly distended without appreciable gallbladder wall thickening. There is no appreciable biliary duct dilatation. Pancreas: No pancreatic mass or inflammatory focus. Spleen: No splenic lesions are evident. Adrenals/Urinary Tract: Adrenals appear normal bilaterally. There is a cyst arising from the anterior upper to mid left kidney measuring 1.9 x 1.9 cm. There is a cyst in the mid right kidney measuring 1.0 x 1.0 cm. There is scarring in the posterior mid to lower right kidney. There is no evident hydronephrosis on  either side. There is no renal or ureteral calculus on either side. Urinary bladder is midline with wall thickness within normal limits for degree of distention. Stomach/Bowel: There are scattered sigmoid diverticula without diverticulitis. There is no bowel wall or mesenteric thickening. There is no bowel obstruction. No evident free air or portal venous air. Vascular/Lymphatic: There is atherosclerotic calcification in the aorta and iliac arteries without aneurysm. Areas of atherosclerotic plaque noted in the aorta. At the proximal right common iliac artery, there appears to be subtotal occlusion due to extensive calcification. No adenopathy is appreciable in the abdomen or pelvis. Reproductive: Uterus absent. There is a left ovarian cyst measuring 3.6 x 2.9 cm, stable. No new pelvic mass. Other: No evident. Appendiceal region lesion. No abscess or ascites appreciable in the abdomen or pelvis. There is a small ventral hernia containing only fat. Musculoskeletal: There is degenerative change throughout the lumbar spine. There is spinal stenosis at L3-4 and L4-5 due to disc protrusion as well as bony hypertrophy. There are no blastic or lytic bone lesions. There is no intramuscular or abdominal wall lesion. Review of the MIP images confirms the above findings. IMPRESSION: CT angiogram chest: 1.  No demonstrable pulmonary embolus. 2. Atherosclerotic calcification in the aorta with areas of plaque in the descending thoracic aorta. No hemodynamically significant obstruction or ulceration seen. No thoracic aortic aneurysm or dissection. 3. Focal area of pneumonia in the posterior aspect of the apical segment of the right upper lobe. Mild bibasilar atelectatic change. 4.  No evident adenopathy. CT abdomen and pelvis: 1. Gallbladder distended without wall thickening. The significance of this finding is uncertain. 2. Extensive aortoiliac atherosclerosis. There appears to be subtotal occlusion of the proximal right common  iliac artery due to calcification. 3. No bowel obstruction. No abscess. No periappendiceal region inflammation. Scattered sigmoid diverticula without diverticulitis. 4.  No renal or ureteral calculus.  No hydronephrosis. 5.  Moderate spinal stenosis at L3-4 and L4-5, multifactorial. 6.  Small ventral hernia containing only fat. Aortic Atherosclerosis (ICD10-I70.0). Electronically Signed   By: Bretta Bang III M.D.   On: 06/15/2017 17:42   Ct Abdomen Pelvis W Contrast  Result Date: 06/15/2017 CLINICAL DATA:  Chest pain. Epigastric pain. Nausea and loss of appetite EXAM: CT ANGIOGRAPHY CHEST CT ABDOMEN AND PELVIS WITH CONTRAST TECHNIQUE: Multidetector CT imaging of the chest was performed using the standard protocol during bolus administration of intravenous contrast. Multiplanar CT image reconstructions and MIPs were obtained to evaluate the vascular anatomy. Multidetector CT imaging of the abdomen and pelvis was performed using the standard protocol during bolus administration of intravenous contrast. CONTRAST:  80 mL Isovue 370 nonionic COMPARISON:  CT abdomen and pelvis December 24, 2015 ; pelvic ultrasound January 05, 2013 FINDINGS: CTA CHEST FINDINGS Cardiovascular: There is no demonstrable pulmonary embolus. There is no thoracic aortic aneurysm or dissection. There is atherosclerotic calcification in the proximal right common and left subclavian arteries. There are foci of calcification in the thoracic aorta. There are foci of coronary artery calcification. The pericardium is not appreciably thickened. There are areas of atherosclerotic plaque in descending thoracic aorta. No ulceration evident. Mediastinum/Nodes: Thyroid appears unremarkable. There is no appreciable thoracic adenopathy. Lungs/Pleura: There is mild scarring in the apices. There is patchy airspace opacity in the posterior aspect of the apical segment of the right upper lobe. There is mild bibasilar atelectasis. There is no pleural  effusion or pleural thickening evident. Musculoskeletal: There is degenerative change in the thoracic spine. There are no blastic or lytic bone lesions. Review of the MIP images confirms the above findings. CT ABDOMEN and PELVIS FINDINGS Hepatobiliary: No focal liver lesions are apparent. Gallbladder is mildly distended without appreciable gallbladder wall thickening. There is no appreciable biliary duct dilatation. Pancreas: No pancreatic mass or inflammatory focus. Spleen: No splenic lesions are evident. Adrenals/Urinary Tract: Adrenals appear normal bilaterally. There is a cyst arising from the anterior upper to mid left kidney measuring 1.9 x 1.9 cm. There is a cyst in the mid right kidney measuring 1.0 x 1.0 cm. There is scarring in the posterior mid to lower right kidney. There is no evident hydronephrosis on either side. There is no renal or ureteral calculus on either side. Urinary bladder is midline with wall thickness within normal limits for degree of distention. Stomach/Bowel: There are scattered sigmoid diverticula without diverticulitis. There is no bowel wall or mesenteric thickening. There is no bowel obstruction. No evident free air or portal venous air. Vascular/Lymphatic: There is atherosclerotic calcification in the aorta and iliac arteries without aneurysm. Areas of atherosclerotic plaque noted in the aorta. At the proximal right common iliac artery, there appears to be subtotal occlusion due to extensive calcification. No adenopathy is appreciable in the abdomen or pelvis. Reproductive: Uterus absent. There is a left ovarian cyst measuring 3.6 x 2.9 cm, stable. No new pelvic mass. Other: No evident. Appendiceal region lesion. No abscess or ascites appreciable in the abdomen or pelvis. There is a small ventral hernia containing only fat. Musculoskeletal: There is degenerative change throughout the lumbar spine. There is spinal stenosis at L3-4 and L4-5 due to disc protrusion as well as bony  hypertrophy. There are no blastic or lytic bone lesions. There is no intramuscular or abdominal wall lesion. Review of the MIP images confirms the above findings. IMPRESSION: CT angiogram chest: 1.  No demonstrable pulmonary embolus. 2. Atherosclerotic calcification in the aorta with areas of plaque in the descending thoracic aorta. No hemodynamically significant obstruction or ulceration seen. No thoracic aortic aneurysm or dissection. 3. Focal area of pneumonia in the posterior aspect of the apical segment of the right upper lobe. Mild bibasilar atelectatic change. 4.  No evident adenopathy. CT abdomen and pelvis: 1. Gallbladder distended without wall thickening. The significance of this finding is uncertain. 2. Extensive aortoiliac atherosclerosis. There appears to be subtotal occlusion of the proximal right common iliac artery due to calcification. 3. No bowel obstruction. No abscess. No periappendiceal region inflammation. Scattered sigmoid diverticula without diverticulitis. 4.  No renal or ureteral calculus.  No hydronephrosis. 5.  Moderate spinal stenosis at L3-4 and L4-5, multifactorial. 6.  Small ventral hernia  containing only fat. Aortic Atherosclerosis (ICD10-I70.0). Electronically Signed   By: Bretta Bang III M.D.   On: 06/15/2017 17:42    Microbiology: Recent Results (from the past 240 hour(s))  Culture, blood (routine x 2) Call MD if unable to obtain prior to antibiotics being given     Status: None (Preliminary result)   Collection Time: 06/15/17 11:38 PM  Result Value Ref Range Status   Specimen Description BLOOD LEFT ARM  Final   Special Requests   Final    BOTTLES DRAWN AEROBIC AND ANAEROBIC Blood Culture results may not be optimal due to an inadequate volume of blood received in culture bottles   Culture NO GROWTH 2 DAYS  Final   Report Status PENDING  Incomplete  Culture, blood (routine x 2) Call MD if unable to obtain prior to antibiotics being given     Status: None  (Preliminary result)   Collection Time: 06/15/17 11:49 PM  Result Value Ref Range Status   Specimen Description BLOOD LEFT HAND  Final   Special Requests   Final    AEROBIC BOTTLE ONLY Blood Culture results may not be optimal due to an inadequate volume of blood received in culture bottles   Culture NO GROWTH 2 DAYS  Final   Report Status PENDING  Incomplete     Labs: Basic Metabolic Panel:  Recent Labs Lab 06/15/17 1427 06/16/17 0615 06/17/17 0405  NA 138 138 137  K 3.0* 3.4* 3.9  CL 100* 100* 106  CO2 27 27 23   GLUCOSE 129* 101* 129*  BUN 15 14 12   CREATININE 1.16* 0.92 0.68  CALCIUM 9.2 8.4* 7.9*   Liver Function Tests:  Recent Labs Lab 06/15/17 1427  AST 31  ALT 36  ALKPHOS 78  BILITOT 0.7  PROT 7.6  ALBUMIN 3.5    Recent Labs Lab 06/15/17 1427  LIPASE 26   No results for input(s): AMMONIA in the last 168 hours. CBC:  Recent Labs Lab 06/15/17 1427 06/16/17 0615 06/17/17 0405  WBC 7.9 6.9 5.5  HGB 13.8 13.1 11.9*  HCT 40.9 38.7 35.7*  MCV 88.1 88.4 88.8  PLT 335 319 310   Cardiac Enzymes:  Recent Labs Lab 06/15/17 1427 06/15/17 1958  TROPONINI 0.16* 0.17*   BNP: BNP (last 3 results) No results for input(s): BNP in the last 8760 hours.  ProBNP (last 3 results) No results for input(s): PROBNP in the last 8760 hours.  CBG:  Recent Labs Lab 06/17/17 1115 06/17/17 1626 06/17/17 2258 06/18/17 0750 06/18/17 1121  GLUCAP 149* 132* 119* 120* 171*       Signed:  Elchanan Bob MD.  [facility] 06/18/2017, 1:10 PM

## 2017-06-21 LAB — CULTURE, BLOOD (ROUTINE X 2)
CULTURE: NO GROWTH
Culture: NO GROWTH

## 2017-08-12 ENCOUNTER — Encounter: Payer: Self-pay | Admitting: "Endocrinology

## 2017-08-25 ENCOUNTER — Encounter: Payer: Self-pay | Admitting: "Endocrinology

## 2017-12-19 IMAGING — CT CT ABD-PELV W/ CM
2 of 4 series · 17 of 46 positions shown, 19 images · IV contrast (Omnipaque 300)
Comparison: 01/05/2013

CLINICAL DATA: Abdominal pain for 2 days

EXAM:
CT ABDOMEN AND PELVIS WITH CONTRAST
TECHNIQUE: Multidetector CT imaging of the abdomen and pelvis was performed
using the standard protocol following bolus administration of
intravenous contrast.
CONTRAST:  100mL OMNIPAQUE IOHEXOL 300 MG/ML  SOLN

[Series 2: abd_pel_with 5.0 b40f · axial · 0.76mm/px · z∈[+251,+636]mm · 14 of 87 slices shown, 16 images]
[im 5/87  soft-tissue]
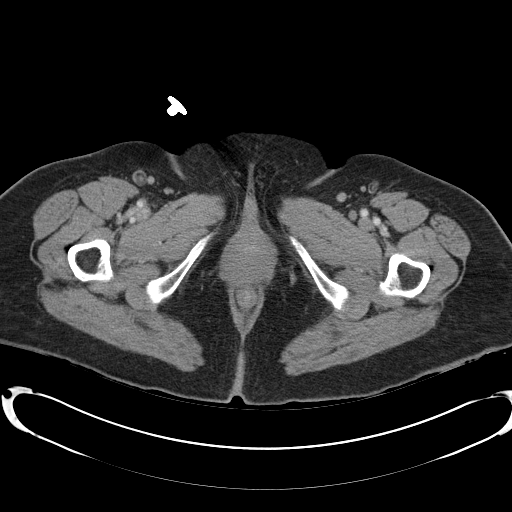
[im 5/87  bone]
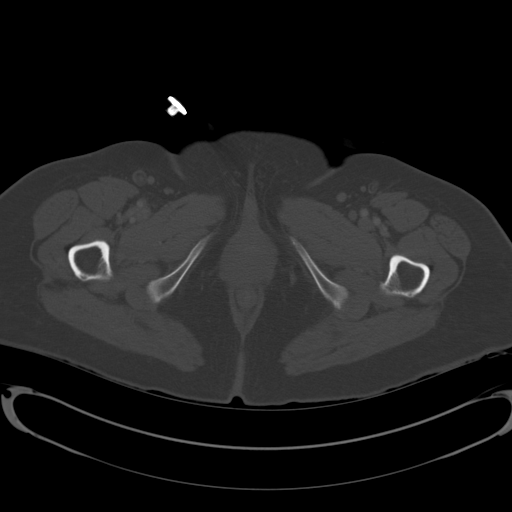
[im 13/87  soft-tissue]
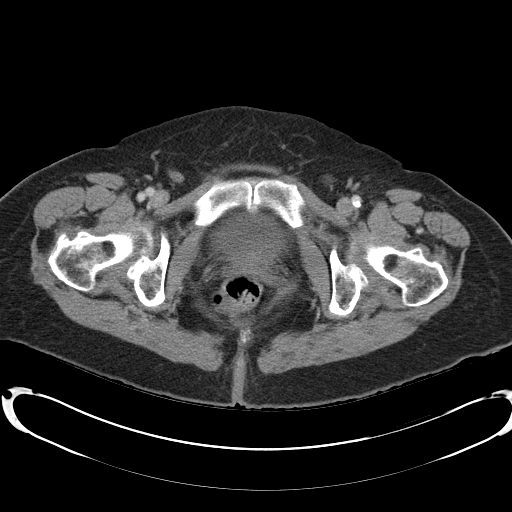
[im 17/87  soft-tissue]
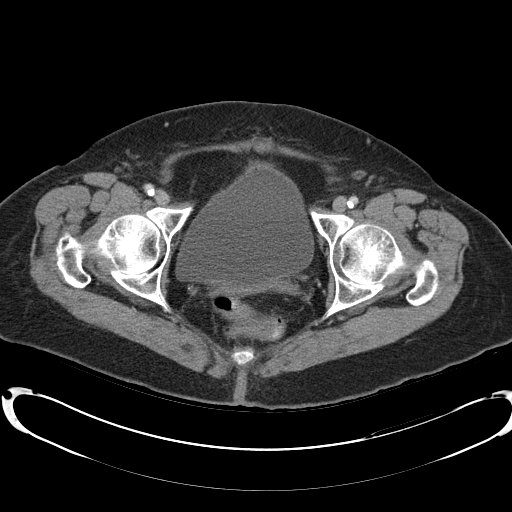
[im 25/87  soft-tissue]
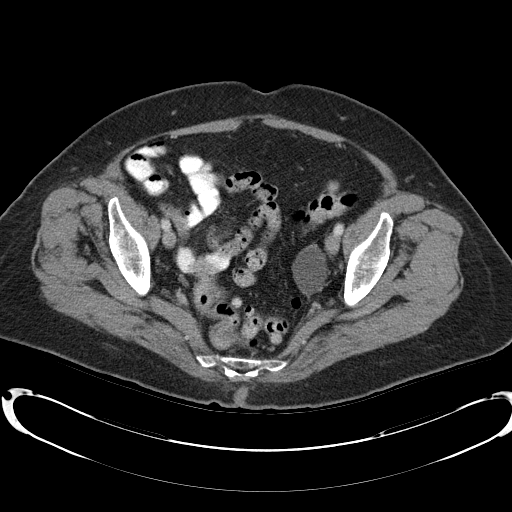
[im 29/87  soft-tissue]
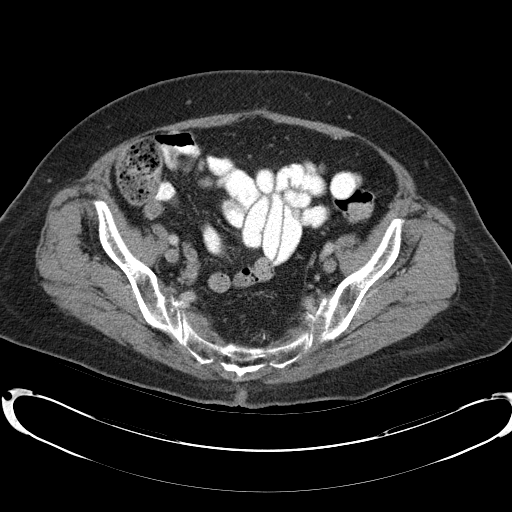
[im 33/87  soft-tissue]
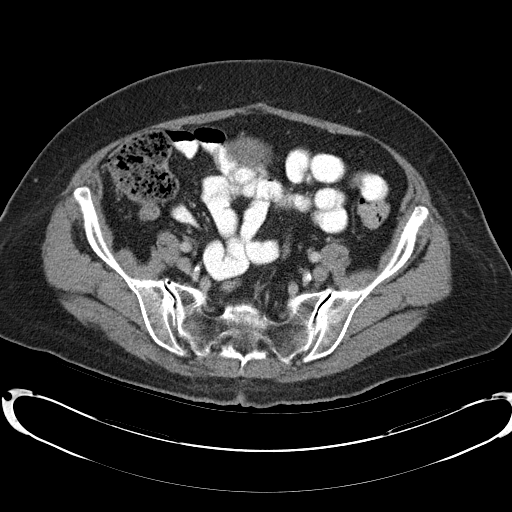
[im 41/87  soft-tissue]
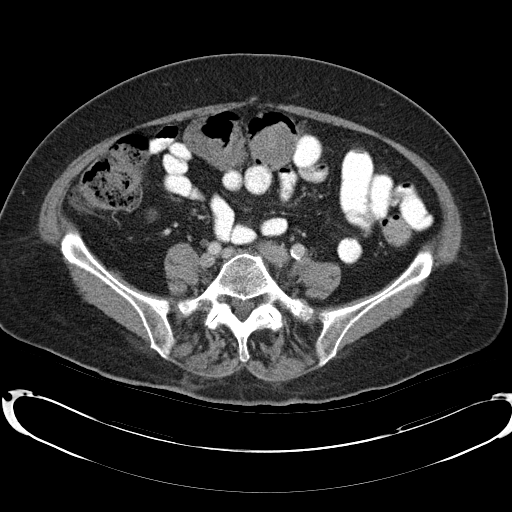
[im 46/87  soft-tissue]
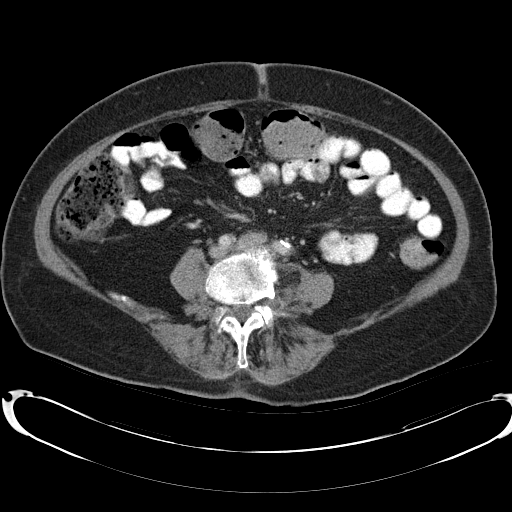
[im 54/87  soft-tissue]
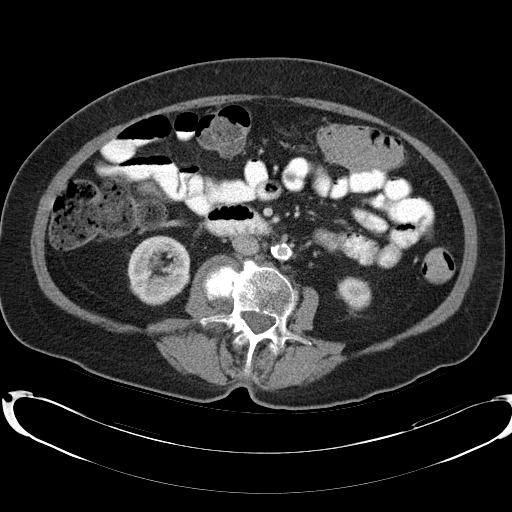
[im 54/87  bone]
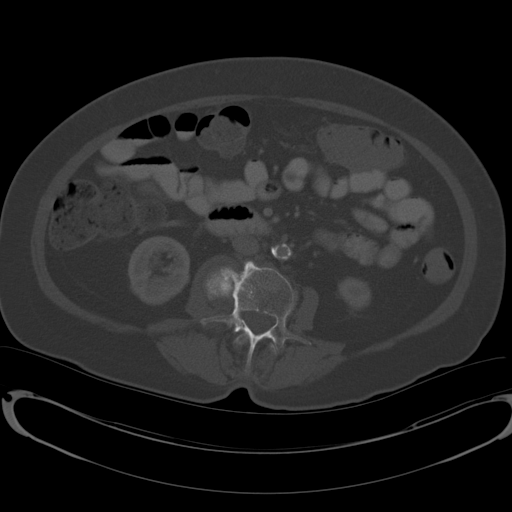
[im 58/87  soft-tissue]
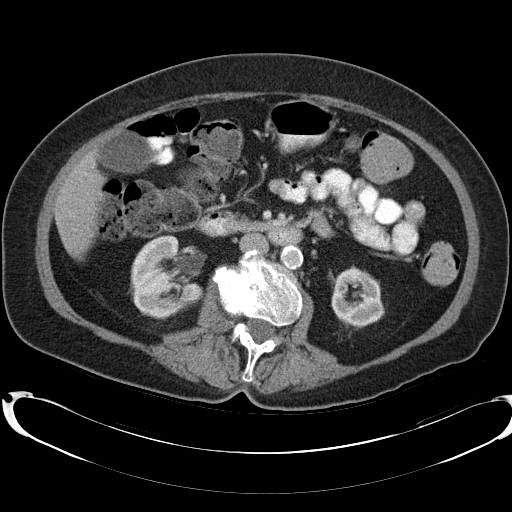
[im 66/87  soft-tissue]
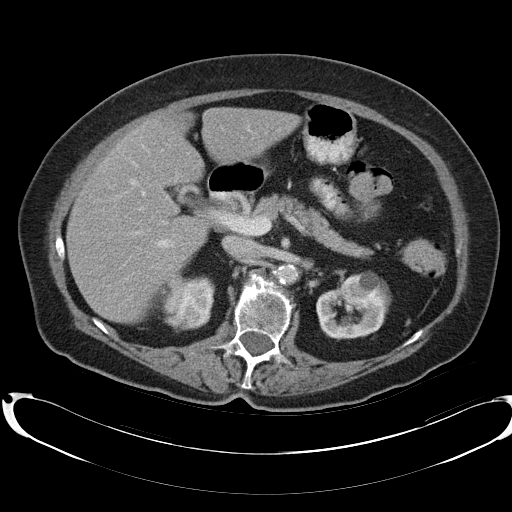
[im 70/87  soft-tissue]
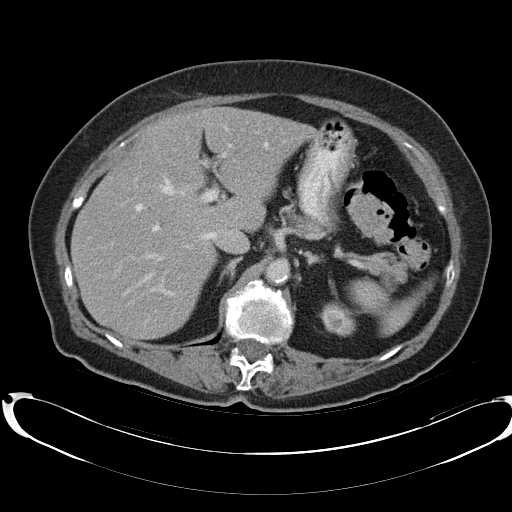
[im 74/87  soft-tissue]
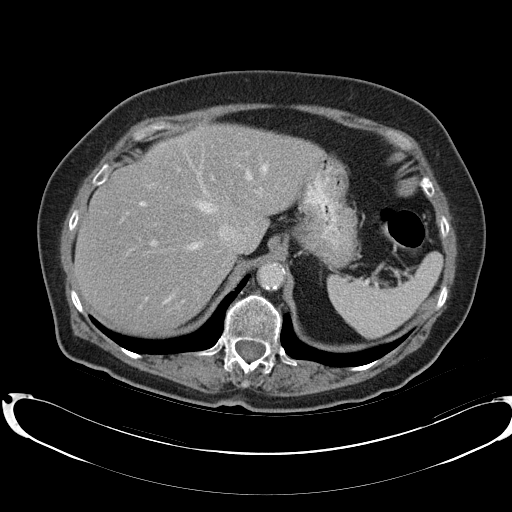
[im 82/87  soft-tissue]
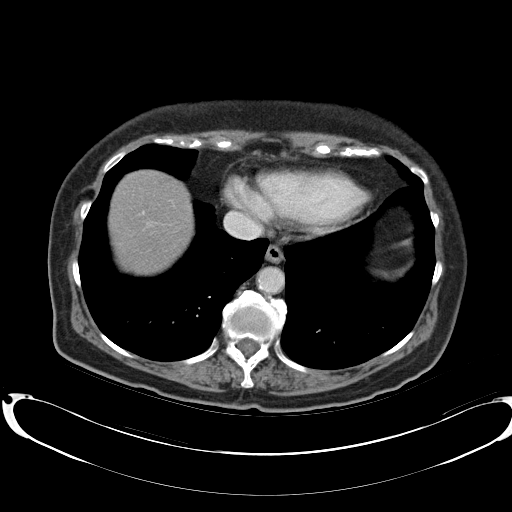

[Series 3: abd_pel_with 3.0 spo cor · coronal · 0.75mm/px · 3 of 84 slices shown]
[im 28/84  soft-tissue]
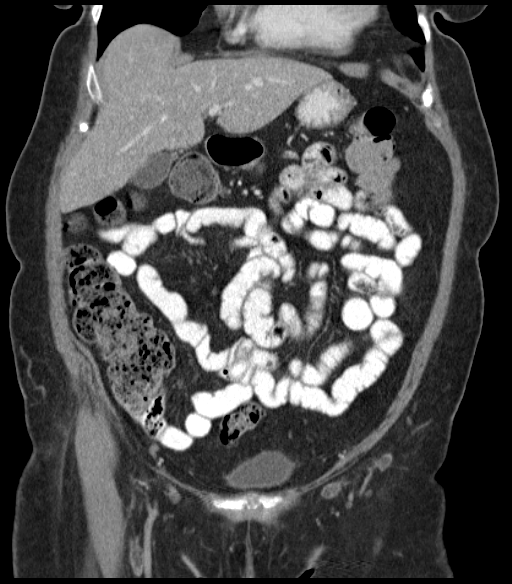
[im 37/84  soft-tissue]
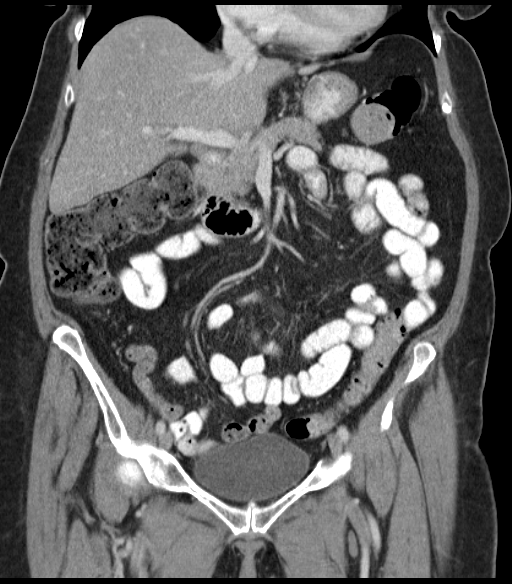
[im 47/84  soft-tissue]
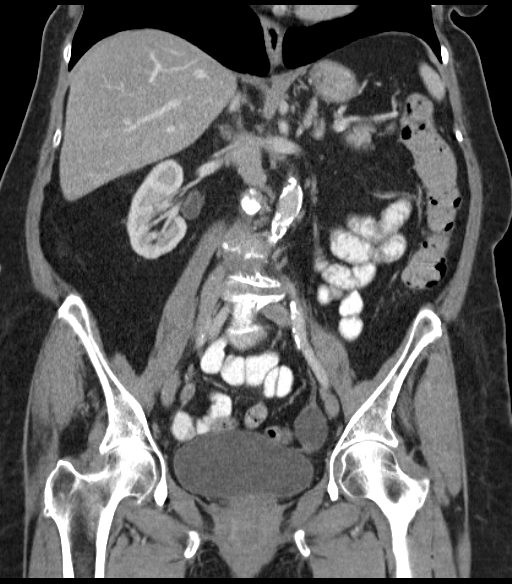

[17 of 46 positions shown; findings below may reference images not displayed]

FINDINGS: Lung bases are free of acute infiltrate or sizable effusion.

The liver is diffusely decreased in attenuation consistent with
fatty infiltration. The gallbladder is well distended. The spleen,
adrenal glands and pancreas are within normal limits.

Kidneys are well visualized bilaterally. No renal calculi or
obstructive changes are seen. Cystic changes are noted on the left.
No obstructive changes are seen.

Aortoiliac calcifications are seen without aneurysmal dilatation.
The uterus has been surgically removed. A left ovarian cyst is noted
which measures 3.7 cm in greatest dimension. This corresponds to a
cystic lesion seen on prior exam in 0786 and is stable in size. It
was mildly complex on the prior ultrasound examination.

The bony structures are within normal limits. The bladder is well
distended. Diverticular change without diverticulitis is noted.
IMPRESSION: Fatty liver.

Left ovarian cyst which is stable in size from a prior ultrasound
from 0786. This is likely of a benign etiology given its stability.

Diverticulosis without diverticulitis.

## 2018-01-20 ENCOUNTER — Other Ambulatory Visit: Payer: Self-pay | Admitting: Anesthesiology

## 2018-01-20 DIAGNOSIS — M5442 Lumbago with sciatica, left side: Secondary | ICD-10-CM

## 2018-01-27 ENCOUNTER — Ambulatory Visit
Admission: RE | Admit: 2018-01-27 | Discharge: 2018-01-27 | Disposition: A | Discharge status: Home / Self Care | Payer: Medicare PPO | Source: Ambulatory Visit | Attending: Anesthesiology | Admitting: Anesthesiology

## 2018-01-27 DIAGNOSIS — M5442 Lumbago with sciatica, left side: Secondary | ICD-10-CM

## 2018-07-19 ENCOUNTER — Encounter (HOSPITAL_COMMUNITY): Payer: Self-pay | Admitting: Emergency Medicine

## 2018-07-19 ENCOUNTER — Emergency Department (HOSPITAL_COMMUNITY)
Admission: EM | Admit: 2018-07-19 | Discharge: 2018-07-19 | Disposition: A | Discharge status: Home / Self Care | Payer: Medicare PPO | Attending: Emergency Medicine | Admitting: Emergency Medicine

## 2018-07-19 ENCOUNTER — Other Ambulatory Visit: Payer: Self-pay

## 2018-07-19 DIAGNOSIS — Z87891 Personal history of nicotine dependence: Secondary | ICD-10-CM | POA: Insufficient documentation

## 2018-07-19 DIAGNOSIS — N390 Urinary tract infection, site not specified: Secondary | ICD-10-CM | POA: Diagnosis not present

## 2018-07-19 DIAGNOSIS — I10 Essential (primary) hypertension: Secondary | ICD-10-CM | POA: Insufficient documentation

## 2018-07-19 DIAGNOSIS — E119 Type 2 diabetes mellitus without complications: Secondary | ICD-10-CM | POA: Insufficient documentation

## 2018-07-19 DIAGNOSIS — Z79899 Other long term (current) drug therapy: Secondary | ICD-10-CM | POA: Diagnosis not present

## 2018-07-19 DIAGNOSIS — Z7984 Long term (current) use of oral hypoglycemic drugs: Secondary | ICD-10-CM | POA: Insufficient documentation

## 2018-07-19 DIAGNOSIS — E860 Dehydration: Secondary | ICD-10-CM | POA: Diagnosis not present

## 2018-07-19 DIAGNOSIS — R4182 Altered mental status, unspecified: Secondary | ICD-10-CM | POA: Diagnosis present

## 2018-07-19 DIAGNOSIS — F0390 Unspecified dementia without behavioral disturbance: Secondary | ICD-10-CM | POA: Diagnosis not present

## 2018-07-19 LAB — TROPONIN I: TROPONIN I: 0.03 ng/mL — AB (ref <0.03)

## 2018-07-19 LAB — CBC WITH DIFFERENTIAL/PLATELET
BASOS ABS: 0 10*3/uL (ref 0.0–0.1)
Basophils Relative: 0 %
EOS ABS: 0.1 10*3/uL (ref 0.0–0.7)
EOS PCT: 1 %
HCT: 43.5 % (ref 36.0–46.0)
HEMOGLOBIN: 14.4 g/dL (ref 12.0–15.0)
LYMPHS ABS: 2.8 10*3/uL (ref 0.7–4.0)
LYMPHS PCT: 30 %
MCH: 29.4 pg (ref 26.0–34.0)
MCHC: 33.1 g/dL (ref 30.0–36.0)
MCV: 88.8 fL (ref 78.0–100.0)
Monocytes Absolute: 0.8 10*3/uL (ref 0.1–1.0)
Monocytes Relative: 9 %
NEUTROS PCT: 60 %
Neutro Abs: 5.4 10*3/uL (ref 1.7–7.7)
PLATELETS: 277 10*3/uL (ref 150–400)
RBC: 4.9 MIL/uL (ref 3.87–5.11)
RDW: 13.9 % (ref 11.5–15.5)
WBC: 9.1 10*3/uL (ref 4.0–10.5)

## 2018-07-19 LAB — URINALYSIS, ROUTINE W REFLEX MICROSCOPIC
Bacteria, UA: NONE SEEN
Bilirubin Urine: NEGATIVE
GLUCOSE, UA: NEGATIVE mg/dL
Hgb urine dipstick: NEGATIVE
Ketones, ur: 20 mg/dL — AB
NITRITE: NEGATIVE
Protein, ur: 100 mg/dL — AB
SPECIFIC GRAVITY, URINE: 1.02 (ref 1.005–1.030)
WBC, UA: >50 WBC/hpf — ABNORMAL HIGH (ref 0–5)
pH: 7 (ref 5.0–8.0)

## 2018-07-19 LAB — COMPREHENSIVE METABOLIC PANEL WITH GFR
ALT: 26 U/L (ref 0–44)
AST: 36 U/L (ref 15–41)
Albumin: 4.4 g/dL (ref 3.5–5.0)
Alkaline Phosphatase: 63 U/L (ref 38–126)
Anion gap: 11 (ref 5–15)
BUN: 29 mg/dL — ABNORMAL HIGH (ref 8–23)
CO2: 25 mmol/L (ref 22–32)
Calcium: 9.5 mg/dL (ref 8.9–10.3)
Chloride: 104 mmol/L (ref 98–111)
Creatinine, Ser: 1.63 mg/dL — ABNORMAL HIGH (ref 0.44–1.00)
GFR calc Af Amer: 33 mL/min — ABNORMAL LOW
GFR calc non Af Amer: 28 mL/min — ABNORMAL LOW
Glucose, Bld: 78 mg/dL (ref 70–99)
Potassium: 4.1 mmol/L (ref 3.5–5.1)
Sodium: 140 mmol/L (ref 135–145)
Total Bilirubin: 0.9 mg/dL (ref 0.3–1.2)
Total Protein: 8.3 g/dL — ABNORMAL HIGH (ref 6.5–8.1)

## 2018-07-19 MED ORDER — ALPRAZOLAM 0.5 MG PO TABS
ORAL_TABLET | ORAL | Status: AC
  Filled 2018-07-19: qty 1

## 2018-07-19 MED ORDER — CEPHALEXIN 500 MG PO CAPS: 500.0000 mg | ORAL_CAPSULE | Freq: Two times a day (BID) | ORAL | 0 refills | Status: AC

## 2018-07-19 MED ORDER — SODIUM CHLORIDE 0.9 % IV BOLUS
1000.0000 mL | Freq: Once | INTRAVENOUS | Status: AC
  Administered 2018-07-19: 1000 mL via INTRAVENOUS

## 2018-07-19 MED ORDER — CEPHALEXIN 500 MG PO CAPS: 500.0000 mg | ORAL_CAPSULE | Freq: Two times a day (BID) | ORAL | 0 refills | Status: DC

## 2018-07-19 MED ORDER — ALPRAZOLAM 0.5 MG PO TABS
0.2500 mg | ORAL_TABLET | Freq: Once | ORAL | Status: AC
  Administered 2018-07-19: 0.25 mg via ORAL

## 2018-07-19 MED ORDER — ALPRAZOLAM 0.25 MG PO TABS: 0.2500 mg | ORAL_TABLET | Freq: Three times a day (TID) | ORAL | 0 refills | Status: DC | PRN

## 2018-07-19 MED ORDER — SODIUM CHLORIDE 0.9 % IV SOLN
1.0000 g | Freq: Once | INTRAVENOUS | Status: AC
  Administered 2018-07-19: 1 g via INTRAVENOUS
  Filled 2018-07-19: qty 10
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
## 2018-07-19 NOTE — Discharge Instructions (Signed)
As discussed, your evaluation today has been largely reassuring.  But, it is important that you monitor your condition carefully, and do not hesitate to return to the ED if you develop new, or concerning changes in your condition. ? ?Otherwise, please follow-up with your physician for appropriate ongoing care. ? ?
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
## 2018-07-19 NOTE — ED Notes (Signed)
CRITICAL VALUE ALERT  Critical Value:  Troponin 0.03  Date & Time Notied:  07/19/2018 2035  Provider Notified: [email or web address]  Orders Received/Actions taken: md notified

## 2018-10-18 DIAGNOSIS — M25519 Pain in unspecified shoulder: Secondary | ICD-10-CM | POA: Insufficient documentation

## 2018-12-04 ENCOUNTER — Other Ambulatory Visit: Payer: Self-pay

## 2018-12-04 ENCOUNTER — Observation Stay (HOSPITAL_COMMUNITY)
Admission: EM | Admit: 2018-12-04 | Discharge: 2018-12-05 | Disposition: A | Discharge status: Home / Self Care | Payer: Medicare PPO | Attending: Internal Medicine | Admitting: Internal Medicine

## 2018-12-04 ENCOUNTER — Observation Stay (HOSPITAL_COMMUNITY): Payer: Medicare PPO

## 2018-12-04 ENCOUNTER — Encounter (HOSPITAL_COMMUNITY): Payer: Self-pay | Admitting: Emergency Medicine

## 2018-12-04 ENCOUNTER — Emergency Department (HOSPITAL_COMMUNITY): Payer: Medicare PPO

## 2018-12-04 DIAGNOSIS — F0390 Unspecified dementia without behavioral disturbance: Secondary | ICD-10-CM | POA: Diagnosis not present

## 2018-12-04 DIAGNOSIS — F419 Anxiety disorder, unspecified: Secondary | ICD-10-CM | POA: Diagnosis not present

## 2018-12-04 DIAGNOSIS — I10 Essential (primary) hypertension: Secondary | ICD-10-CM | POA: Diagnosis not present

## 2018-12-04 DIAGNOSIS — E785 Hyperlipidemia, unspecified: Secondary | ICD-10-CM | POA: Insufficient documentation

## 2018-12-04 DIAGNOSIS — E1165 Type 2 diabetes mellitus with hyperglycemia: Secondary | ICD-10-CM | POA: Diagnosis not present

## 2018-12-04 DIAGNOSIS — Z87891 Personal history of nicotine dependence: Secondary | ICD-10-CM | POA: Insufficient documentation

## 2018-12-04 DIAGNOSIS — R911 Solitary pulmonary nodule: Secondary | ICD-10-CM | POA: Diagnosis not present

## 2018-12-04 DIAGNOSIS — F329 Major depressive disorder, single episode, unspecified: Secondary | ICD-10-CM | POA: Insufficient documentation

## 2018-12-04 DIAGNOSIS — R079 Chest pain, unspecified: Secondary | ICD-10-CM

## 2018-12-04 DIAGNOSIS — R109 Unspecified abdominal pain: Secondary | ICD-10-CM

## 2018-12-04 DIAGNOSIS — Z79899 Other long term (current) drug therapy: Secondary | ICD-10-CM | POA: Diagnosis not present

## 2018-12-04 DIAGNOSIS — R0789 Other chest pain: Secondary | ICD-10-CM | POA: Diagnosis present

## 2018-12-04 HISTORY — DX: Unspecified dementia, unspecified severity, without behavioral disturbance, psychotic disturbance, mood disturbance, and anxiety: F03.90

## 2018-12-04 LAB — DIFFERENTIAL
Abs Immature Granulocytes: 0.03 10*3/uL (ref 0.00–0.07)
Basophils Absolute: 0.1 10*3/uL (ref 0.0–0.1)
Basophils Relative: 1 %
EOS PCT: 5 %
Eosinophils Absolute: 0.3 10*3/uL (ref 0.0–0.5)
IMMATURE GRANULOCYTES: 0 %
LYMPHS PCT: 38 %
Lymphs Abs: 2.6 10*3/uL (ref 0.7–4.0)
MONO ABS: 0.7 10*3/uL (ref 0.1–1.0)
Monocytes Relative: 10 %
NEUTROS ABS: 3.2 10*3/uL (ref 1.7–7.7)
Neutrophils Relative %: 46 %

## 2018-12-04 LAB — HEPATIC FUNCTION PANEL
ALBUMIN: 4.1 g/dL (ref 3.5–5.0)
ALK PHOS: 81 U/L (ref 38–126)
ALT: 18 U/L (ref 0–44)
AST: 24 U/L (ref 15–41)
Bilirubin, Direct: 0.1 mg/dL (ref 0.0–0.2)
Indirect Bilirubin: 0.4 mg/dL (ref 0.3–0.9)
TOTAL PROTEIN: 8.1 g/dL (ref 6.5–8.1)
Total Bilirubin: 0.5 mg/dL (ref 0.3–1.2)

## 2018-12-04 LAB — BASIC METABOLIC PANEL
Anion gap: 9 (ref 5–15)
BUN: 16 mg/dL (ref 8–23)
CALCIUM: 9.3 mg/dL (ref 8.9–10.3)
CO2: 26 mmol/L (ref 22–32)
CREATININE: 1.07 mg/dL — AB (ref 0.44–1.00)
Chloride: 101 mmol/L (ref 98–111)
GFR calc non Af Amer: 48 mL/min — ABNORMAL LOW (ref >60)
GFR, EST AFRICAN AMERICAN: 56 mL/min — AB (ref >60)
GLUCOSE: 103 mg/dL — AB (ref 70–99)
Potassium: 4.1 mmol/L (ref 3.5–5.1)
Sodium: 136 mmol/L (ref 135–145)

## 2018-12-04 LAB — URINALYSIS, ROUTINE W REFLEX MICROSCOPIC
BILIRUBIN URINE: NEGATIVE
GLUCOSE, UA: NEGATIVE mg/dL
Hgb urine dipstick: NEGATIVE
KETONES UR: NEGATIVE mg/dL
Leukocytes, UA: NEGATIVE
Nitrite: NEGATIVE
PH: 8 (ref 5.0–8.0)
Protein, ur: NEGATIVE mg/dL
SPECIFIC GRAVITY, URINE: 1.005 (ref 1.005–1.030)

## 2018-12-04 LAB — CBC
HEMATOCRIT: 44.2 % (ref 36.0–46.0)
Hemoglobin: 14.3 g/dL (ref 12.0–15.0)
MCH: 28.9 pg (ref 26.0–34.0)
MCHC: 32.4 g/dL (ref 30.0–36.0)
MCV: 89.5 fL (ref 80.0–100.0)
PLATELETS: 320 10*3/uL (ref 150–400)
RBC: 4.94 MIL/uL (ref 3.87–5.11)
RDW: 12.6 % (ref 11.5–15.5)
WBC: 6.7 10*3/uL (ref 4.0–10.5)
nRBC: 0 % (ref 0.0–0.2)

## 2018-12-04 LAB — TROPONIN I: Troponin I: 0.04 ng/mL (ref <0.03)

## 2018-12-04 LAB — GLUCOSE, CAPILLARY: Glucose-Capillary: 111 mg/dL — ABNORMAL HIGH (ref 70–99)

## 2018-12-04 LAB — D-DIMER, QUANTITATIVE (NOT AT ARMC): D DIMER QUANT: 1.04 ug{FEU}/mL — AB (ref 0.00–0.50)

## 2018-12-04 MED ORDER — AMLODIPINE BESYLATE 5 MG PO TABS
10.0000 mg | ORAL_TABLET | Freq: Every day | ORAL | Status: DC
  Administered 2018-12-05: 10 mg via ORAL
  Filled 2018-12-04: qty 2

## 2018-12-04 MED ORDER — ENOXAPARIN SODIUM 40 MG/0.4ML ~~LOC~~ SOLN
40.0000 mg | SUBCUTANEOUS | Status: DC
  Administered 2018-12-05: 40 mg via SUBCUTANEOUS
  Filled 2018-12-04: qty 0.4

## 2018-12-04 MED ORDER — SODIUM CHLORIDE 0.9% FLUSH: 3.0000 mL | Freq: Once | INTRAVENOUS | Status: DC

## 2018-12-04 MED ORDER — LINAGLIPTIN 5 MG PO TABS
5.0000 mg | ORAL_TABLET | Freq: Every day | ORAL | Status: DC
  Administered 2018-12-05: 5 mg via ORAL
  Filled 2018-12-04: qty 1

## 2018-12-04 MED ORDER — IOPAMIDOL (ISOVUE-370) INJECTION 76%
100.0000 mL | Freq: Once | INTRAVENOUS | Status: AC | PRN
  Administered 2018-12-04: 100 mL via INTRAVENOUS

## 2018-12-04 MED ORDER — SIMVASTATIN 20 MG PO TABS
20.0000 mg | ORAL_TABLET | Freq: Every evening | ORAL | Status: DC
  Administered 2018-12-05: 20 mg via ORAL
  Filled 2018-12-04: qty 1

## 2018-12-04 MED ORDER — ACETAMINOPHEN 650 MG RE SUPP: 650.0000 mg | Freq: Four times a day (QID) | RECTAL | Status: DC | PRN

## 2018-12-04 MED ORDER — METOPROLOL TARTRATE 50 MG PO TABS
50.0000 mg | ORAL_TABLET | Freq: Two times a day (BID) | ORAL | Status: DC
  Administered 2018-12-05 (×2): 50 mg via ORAL
  Filled 2018-12-04 (×2): qty 1

## 2018-12-04 MED ORDER — HYDROCODONE-ACETAMINOPHEN 5-325 MG PO TABS
1.0000 | ORAL_TABLET | Freq: Once | ORAL | Status: AC
  Administered 2018-12-04: 1 via ORAL
  Filled 2018-12-04: qty 1

## 2018-12-04 MED ORDER — INSULIN ASPART 100 UNIT/ML ~~LOC~~ SOLN
0.0000 [IU] | Freq: Three times a day (TID) | SUBCUTANEOUS | Status: DC
  Administered 2018-12-05: 2 [IU] via SUBCUTANEOUS

## 2018-12-04 MED ORDER — FAMOTIDINE 20 MG PO TABS
20.0000 mg | ORAL_TABLET | Freq: Every day | ORAL | Status: DC
  Administered 2018-12-05: 20 mg via ORAL
  Filled 2018-12-04: qty 1

## 2018-12-04 MED ORDER — ONDANSETRON HCL 4 MG/2ML IJ SOLN: 4.0000 mg | Freq: Four times a day (QID) | INTRAMUSCULAR | Status: DC | PRN

## 2018-12-04 MED ORDER — ASPIRIN 325 MG PO TABS
325.0000 mg | ORAL_TABLET | Freq: Once | ORAL | Status: AC
  Administered 2018-12-04: 325 mg via ORAL
  Filled 2018-12-04: qty 1

## 2018-12-04 MED ORDER — POLYETHYLENE GLYCOL 3350 17 G PO PACK: 17.0000 g | PACK | Freq: Every day | ORAL | Status: DC | PRN

## 2018-12-04 MED ORDER — QUETIAPINE FUMARATE 100 MG PO TABS
100.0000 mg | ORAL_TABLET | Freq: Every day | ORAL | Status: DC
  Administered 2018-12-05: 100 mg via ORAL
  Filled 2018-12-04: qty 1

## 2018-12-04 MED ORDER — SODIUM CHLORIDE 0.9 % IV SOLN
INTRAVENOUS | Status: AC
  Administered 2018-12-05: via INTRAVENOUS

## 2018-12-04 MED ORDER — ONDANSETRON HCL 4 MG PO TABS: 4.0000 mg | ORAL_TABLET | Freq: Four times a day (QID) | ORAL | Status: DC | PRN

## 2018-12-04 MED ORDER — TRAMADOL HCL 50 MG PO TABS: 50.0000 mg | ORAL_TABLET | Freq: Four times a day (QID) | ORAL | Status: DC | PRN

## 2018-12-04 MED ORDER — ASPIRIN EC 81 MG PO TBEC
81.0000 mg | DELAYED_RELEASE_TABLET | Freq: Every day | ORAL | Status: DC
  Administered 2018-12-05: 81 mg via ORAL
  Filled 2018-12-04: qty 1

## 2018-12-04 MED ORDER — ACETAMINOPHEN 325 MG PO TABS: 650.0000 mg | ORAL_TABLET | Freq: Four times a day (QID) | ORAL | Status: DC | PRN

## 2018-12-04 NOTE — H&P (Signed)
History and Physical    Haley MathRuth L Platas WUJ:[id number] DOB: April 28, 1933 DOA: 12/04/2018  PCP: Smith RobertKikel, Stephen, MD   Patient coming from: Home  I [facility] personally briefly [facility]  Chief Complaint: left flank/rib pain  HPI: Haley Nguyen is a 85 y.o. female with medical history significant for DM2, HTN, RBBB, who presented to the ED with complaints of left-sided flank pain radiating anteriorly underneath her left breast.  No associated difficulty breathing.  Daughter is present at bedside and helps with some of the history due to the patient's baseline cognitive deficits.  Daughter reports chronic dizziness for months.  Patient also has chronic lower back issues for which she gets spinal injections intermittently.  ED Course: Stable vitals.  Unremarkable CBC CMP, troponin elevated 0.04 but chronically elevated.  Elevated d-dimer 1.04.  Subsequent CT anterior chest-right upper lobe soft tissue pulmonary nodule. Aspirin given, hopsitalist to admit for chest pain rule out ACS.  Patient at this time is chest pain free s/p Norco.   Review of Systems: As per HPI all other systems reviewed are negative.  Past Medical History:  Diagnosis Date  . Arthritis   . Bilateral renal cysts 06/16/2017  . CAP (community acquired pneumonia) 06/15/2017  . DDD (degenerative disc disease), lumbar   . Dementia (HCC)   . Diabetes mellitus without complication (HCC)   . Diverticulosis   . Essential hypertension   . Right bundle branch block 06/16/2017  . RLQ abdominal pain 05/29/2013  . Spinal stenosis at L4-L5 level 06/16/2017    Past Surgical History:  Procedure Laterality Date  . ABDOMINAL HYSTERECTOMY    . INNER EAR SURGERY     "had ear surgery years ago and there's metal left in there"  . LAPAROSCOPIC LYSIS OF ADHESIONS N/A 03/02/2013   Procedure: LAPAROSCOPIC LYSIS OF ADHESIONS;  Surgeon: Lazaro ArmsLuther H Eure, MD;  Location: [facility];  Service: Gynecology;   Laterality: N/A;  . LAPAROSCOPIC SALPINGO OOPHERECTOMY Bilateral 03/02/2013   Procedure: LAPAROSCOPIC SALPINGO OOPHORECTOMY;  Surgeon: Lazaro ArmsLuther H Eure, MD;  Location: [facility];  Service: Gynecology;  Laterality: Bilateral;     reports that she has quit smoking. Her smoking use included cigarettes. She quit after 30.00 years of use. She has quit using smokeless tobacco.  Her smokeless tobacco use included chew and snuff. She reports previous alcohol use. She reports that she does not use drugs.  Allergies  Allergen Reactions  . Ibuprofen     Messes with kidneys     Family History  Problem Relation Age of Onset  . Heart disease Father   . Heart disease Mother     Prior to Admission medications   Medication Sig Start Date End Date Taking? Authorizing Provider  amLODipine (NORVASC) 10 MG tablet Take 10 mg by mouth every morning.    Yes [provider]  docusate sodium (COLACE) 100 MG capsule Take 200 mg by mouth 2 (two) times daily as needed for mild constipation or moderate constipation.    Yes [provider]  famotidine (PEPCID) 20 MG tablet Take 1 tablet (20 mg total) by mouth daily. 06/19/17  Yes Elliot CousinFisher, Denise, MD  metoprolol (LOPRESSOR) 50 MG tablet Take 50 mg by mouth 2 (two) times daily.   Yes [provider]  Omega-3 Fatty Acids (FISH OIL) 1000 MG CAPS Take 1 capsule by mouth daily.   Yes [provider]  Psyllium (FIBER) 0.52 g CAPS Take 2 capsules by mouth 2 (two) times  daily.   Yes [provider]  QUEtiapine (SEROQUEL) 100 MG tablet Take 1 tablet by mouth at bedtime. 11/25/18  Yes [provider]  simvastatin (ZOCOR) 40 MG tablet Take 20 mg by mouth every evening.   Yes [provider]  sitaGLIPtin (JANUVIA) 50 MG tablet Take 50 mg by mouth every morning.   Yes [provider]  traMADol (ULTRAM) 50 MG tablet Take 50 mg by mouth every 6 (six) hours as needed for pain.   Yes [provider]  ALPRAZolam  (XANAX) 0.25 MG tablet Take 1 tablet (0.25 mg total) by mouth 3 (three) times daily as needed for anxiety. 07/19/18   Loren RacerYelverton, David, MD  metroNIDAZOLE (FLAGYL) 500 MG tablet Take 500 mg by mouth 3 (three) times daily. 06/30/18   [provider]    Physical Exam: Vitals:   12/04/18 1658 12/04/18 1749 12/04/18 1830 12/04/18 1930  BP:  (!) 152/77 (!) 151/69 (!) 137/102  Pulse:  75 71 68  Resp:  (!) 21 17 18   Temp:      TempSrc:      SpO2:  100% 99% 99%  Weight: 79.4 kg     Height: 5\' 6"  (1.676 m)       Constitutional: NAD, calm, comfortable Vitals:   12/04/18 1658 12/04/18 1749 12/04/18 1830 12/04/18 1930  BP:  (!) 152/77 (!) 151/69 (!) 137/102  Pulse:  75 71 68  Resp:  (!) 21 17 18   Temp:      TempSrc:      SpO2:  100% 99% 99%  Weight: 79.4 kg     Height: 5\' 6"  (1.676 m)      Eyes: PERRL, lids and conjunctivae normal ENMT: Mucous membranes are moist. Posterior pharynx clear of any exudate or lesions.\ Neck: normal, supple, no masses, no thyromegaly Respiratory: clear to auscultation bilaterally, no wheezing, no crackles. Normal respiratory effort. No accessory muscle use.  Cardiovascular: Regular rate and rhythm, no murmurs / rubs / gallops. No extremity edema. 2+ pedal pulses. No carotid bruits.   Abdomen: no tenderness, no masses palpated. No hepatosplenomegaly. Bowel sounds positive.  Musculoskeletal: no clubbing / cyanosis. No joint deformity upper and lower extremities. Good ROM, no contractures. Normal muscle tone.  Skin:Reddish plaque-like rash ~3 by 2 cm left upper back, which daughter reports is chronic, also has same rash underneath left breast, and near axillarly line.- but daughter is unaware of acuity or chronicity. Neurologic: CN 2-12 grossly intact.  Strength 5/5 in all 4.  Psychiatric:  Moderate cognitive deficits, Alert and oriented x 2. Normal mood.   Labs on Admission: I have personally reviewed following labs and imaging studies  CBC: Recent Labs   Lab 12/04/18 1748  WBC 6.7  NEUTROABS 3.2  HGB 14.3  HCT 44.2  MCV 89.5  PLT 320   Basic Metabolic Panel: Recent Labs  Lab 12/04/18 1748  NA 136  K 4.1  CL 101  CO2 26  GLUCOSE 103*  BUN 16  CREATININE 1.07*  CALCIUM 9.3   GFR: Estimated Creatinine Clearance: 42.3 mL/min (A) (by C-G formula based on SCr of 1.07 mg/dL (H)). Liver Function Tests: Recent Labs  Lab 12/04/18 1748  AST 24  ALT 18  ALKPHOS 81  BILITOT 0.5  PROT 8.1  ALBUMIN 4.1   Cardiac Enzymes: Recent Labs  Lab 12/04/18 1748  TROPONINI 0.04*   Urine analysis:    Component Value Date/Time   COLORURINE STRAW (A) 12/04/2018 1745   APPEARANCEUR CLEAR 12/04/2018 1745  LABSPEC 1.005 12/04/2018 1745   PHURINE 8.0 12/04/2018 1745   GLUCOSEU NEGATIVE 12/04/2018 1745   HGBUR NEGATIVE 12/04/2018 1745   BILIRUBINUR NEGATIVE 12/04/2018 1745   KETONESUR NEGATIVE 12/04/2018 1745   PROTEINUR NEGATIVE 12/04/2018 1745   UROBILINOGEN 0.2 02/23/2013 1042   NITRITE NEGATIVE 12/04/2018 1745   LEUKOCYTESUR NEGATIVE 12/04/2018 1745    Radiological Exams on Admission: Dg Chest 2 View  Result Date: 12/04/2018 CLINICAL DATA:  Left flank and rib pain. EXAM: CHEST - 2 VIEW COMPARISON:  June 15, 2017 FINDINGS: The heart size and mediastinal contours are within normal limits. Both lungs are clear. The visualized skeletal structures are unremarkable. IMPRESSION: No active cardiopulmonary disease. Electronically Signed   By: Gerome Sam III M.D   On: 12/04/2018 18:17   Ct Angio Chest Pe W And/or Wo Contrast  Result Date: 12/04/2018 CLINICAL DATA:  Dizziness. Midsternal chest pain. Left flank pain and left rib pain. EXAM: CT ANGIOGRAPHY CHEST WITH CONTRAST TECHNIQUE: Multidetector CT imaging of the chest was performed using the standard protocol during bolus administration of intravenous contrast. Multiplanar CT image reconstructions and MIPs were obtained to evaluate the vascular anatomy. CONTRAST:   ISOVUE-370 IOPAMIDOL (ISOVUE-370) INJECTION 76% COMPARISON:  Chest radiograph 12/04/2018, chest CT 06/15/2017 FINDINGS: Cardiovascular: Mildly enlarged heart. Calcific atherosclerotic disease of the aorta and coronary arteries. No evidence of central pulmonary embolus. Mediastinum/Nodes: No enlarged mediastinal, hilar, or axillary lymph nodes. Thyroid gland, trachea, and esophagus demonstrate no significant findings. Lungs/Pleura: 6 mm right upper lobe subpleural nodule, image 34/140, sequence 6. Mild peribronchial thickening in the right upper lobe, likely chronic bronchitic changes. No evidence of lobar consolidation, pleural effusion or pneumothorax. Atelectatic changes in the lingula. Upper Abdomen: Partially visualized benign-appearing 2.6 cm left renal cyst. Musculoskeletal: Findings of diffuse idiopathic skeletal hyperostosis of the thoracic spine. No suspicious osseous lesions or acute fractures. Review of the MIP images confirms the above findings. IMPRESSION: Mildly enlarged heart. Calcific atherosclerotic disease of the aorta and coronary arteries. 6 mm right upper lobe soft tissue pulmonary nodule. Non-contrast chest CT at 6-12 months is recommended. If the nodule is stable at time of repeat CT, then future CT at 18-24 months (from today's scan) is considered optional for low-risk patients, but is recommended for high-risk patients. This recommendation follows the consensus statement: Guidelines for Management of Incidental Pulmonary Nodules Detected on CT Images: From the Fleischner Society 2017; Radiology 2017; 284:228-243. Aortic Atherosclerosis (ICD10-I70.0). Electronically Signed   By: Ted Mcalpine M.D.   On: 12/04/2018 21:05    EKG: Independently reviewed.  Sinus rhythm.  Old RBBB.  No significant change from prior.  Assessment/Plan Principal Problem:   Chest pain Active Problems:   Pulmonary nodule  Chest pain-troponin 0.04, chronically elevated.  EKG with old RBBB.  Unchanged.  Elevated DDimer, subsequent CTA negative for pulmonary embolism, shows pulmonary nodule.  Differentials radicular pain from back. Rash appears chronic.  -EKG a.m - ECHO - trend trop - Cardiology consult- order placed  -Thoracic spine x-ray - Hydrate s/p Contrast N/s 100cc/hr x 12 hrs - Cont statins  Pulmonary nodule- 6 mm right upper lobe soft tissue pulmonary nodule. Non-contrast chest CT at 6-12 months is recommended. If the nodule is stable at time of repeat CT, then future CT at 18-24 months (from today's scan) is considered optional for low-risk patients, but is recommended for high-risk patients. -Problem list updated, follow-up outpatient.  DM2-glucose 103, Hgba1c- 08/25/18- 6.  - SSI-s  HTN- stable. - Cont home norvasc and metoprolol  Dementia- mostly Short term memory probs. Patient lives with granddaughter and family. - cont home seroquel.  DVT prophylaxis: Lovenox Code Status: Full Family Communication: Daughter at bedside Disposition Plan: 1- 2 days Consults called: cards Admission status: Obs, tele   Onnie Boer MD [facility]  12/04/2018, 10:25 PM

## 2018-12-04 NOTE — ED Notes (Signed)
Date and time results received: 12/04/18 6:54 PM  Test: troponin Critical Value: 0.04  Name of Provider Notified: Zammit  Orders Received? Or Actions Taken?: no new orders at this time.

## 2018-12-04 NOTE — ED Triage Notes (Addendum)
Patient c/o left flank pain/ left rib pain. Denies any nausea, vomiting, diarrhea, fevers, or urinary symptoms. Patient states pain started this morning. Patient states pain is worse with movement or deep breath. Patient states pain radiates under left breast. Pain improves briefly with burping.  Per family patient has "stumbled a couple times in past few days and caught herself" but no falls that she is aware of. Patient states she has intermittent brief dizziness "for a while."

## 2018-12-04 NOTE — ED Provider Notes (Signed)
[facility] EMERGENCY DEPARTMENT Provider Note   CSN: [id number] Arrival date & time: 12/04/18  1626     History   Chief Complaint Chief Complaint  Patient presents with  . Flank Pain    HPI Haley Nguyen is a 85 y.o. female.  Patient complains of chest pain and upper back pain.  Some of the pain is worse with inspiration  The history is provided by the patient. No language interpreter was used.  Chest Pain  Pain location:  L chest Pain quality: aching   Pain radiates to:  Upper back Pain severity:  Moderate Onset quality:  Sudden Timing:  Intermittent Progression:  Waxing and waning Chronicity:  New Context: breathing   Relieved by:  Nothing Worsened by:  Nothing Ineffective treatments:  None tried Associated symptoms: back pain   Associated symptoms: no abdominal pain, no cough, no fatigue and no headache     Past Medical History:  Diagnosis Date  . Arthritis   . Bilateral renal cysts 06/16/2017  . CAP (community acquired pneumonia) 06/15/2017  . DDD (degenerative disc disease), lumbar   . Dementia (HCC)   . Diabetes mellitus without complication (HCC)   . Diverticulosis   . Essential hypertension   . Right bundle branch block 06/16/2017  . RLQ abdominal pain 05/29/2013  . Spinal stenosis at L4-L5 level 06/16/2017    Patient Active Problem List   Diagnosis Date Noted  . Shoulder joint pain 10/18/2018  . Bilateral renal cysts 06/16/2017  . Spinal stenosis at L4-L5 level 06/16/2017  . Elevated troponin 06/16/2017  . Hypokalemia 06/16/2017  . Right bundle branch block 06/16/2017  . Epigastric pain 06/15/2017  . CAP (community acquired pneumonia) 06/15/2017  . Essential hypertension 06/15/2017  . Type 2 diabetes mellitus without complication (HCC) 06/15/2017  . Nausea and vomiting in adult 06/15/2017  . Volume depletion 06/15/2017  . Chest pain 06/29/2016    Past Surgical History:  Procedure Laterality Date  . ABDOMINAL HYSTERECTOMY    . INNER EAR  SURGERY     "had ear surgery years ago and there's metal left in there"  . LAPAROSCOPIC LYSIS OF ADHESIONS N/A 03/02/2013   Procedure: LAPAROSCOPIC LYSIS OF ADHESIONS;  Surgeon: Lazaro Arms, MD;  Location: [facility];  Service: Gynecology;  Laterality: N/A;  . LAPAROSCOPIC SALPINGO OOPHERECTOMY Bilateral 03/02/2013   Procedure: LAPAROSCOPIC SALPINGO OOPHORECTOMY;  Surgeon: Lazaro Arms, MD;  Location: [facility];  Service: Gynecology;  Laterality: Bilateral;     OB History    Gravida  1   Para  1   Term      Preterm      AB      Living  1     SAB      TAB      Ectopic      Multiple      Live Births  1            Home Medications    Prior to Admission medications   Medication Sig Start Date End Date Taking? Authorizing Provider  amLODipine (NORVASC) 10 MG tablet Take 10 mg by mouth every morning.    Yes [provider]  docusate sodium (COLACE) 100 MG capsule Take 200 mg by mouth 2 (two) times daily as needed for mild constipation or moderate constipation.    Yes [provider]  famotidine (PEPCID) 20 MG tablet Take 1 tablet (20 mg total) by mouth daily. 06/19/17  Yes Elliot Cousin, MD  metoprolol [place])  50 MG tablet Take 50 mg by mouth 2 (two) times daily.   Yes [provider]  Omega-3 Fatty Acids (FISH OIL) 1000 MG CAPS Take 1 capsule by mouth daily.   Yes [provider]  Psyllium (FIBER) 0.52 g CAPS Take 2 capsules by mouth 2 (two) times daily.   Yes [provider]  QUEtiapine (SEROQUEL) 100 MG tablet Take 1 tablet by mouth at bedtime. 11/25/18  Yes [provider]  simvastatin (ZOCOR) 40 MG tablet Take 20 mg by mouth every evening.   Yes [provider]  sitaGLIPtin (JANUVIA) 50 MG tablet Take 50 mg by mouth every morning.   Yes [provider]  traMADol (ULTRAM) 50 MG tablet Take 50 mg by mouth every 6 (six) hours as needed for pain.   Yes [provider]  ALPRAZolam (XANAX) 0.25  MG tablet Take 1 tablet (0.25 mg total) by mouth 3 (three) times daily as needed for anxiety. 07/19/18   Loren RacerYelverton, David, MD  metroNIDAZOLE (FLAGYL) 500 MG tablet Take 500 mg by mouth 3 (three) times daily. 06/30/18   [provider]    Family History Family History  Problem Relation Age of Onset  . Heart disease Father   . Heart disease Mother     Social History Social History   Tobacco Use  . Smoking status: Former Smoker    Years: 30.00    Types: Cigarettes  . Smokeless tobacco: Former NeurosurgeonUser    Types: Chew, Snuff  Substance Use Topics  . Alcohol use: Not Currently    Comment: occasional drink  . Drug use: No     Allergies   Ibuprofen   Review of Systems Review of Systems  Constitutional: Negative for appetite change and fatigue.  HENT: Negative for congestion, ear discharge and sinus pressure.   Eyes: Negative for discharge.  Respiratory: Negative for cough.   Cardiovascular: Positive for chest pain.  Gastrointestinal: Negative for abdominal pain and diarrhea.  Genitourinary: Negative for frequency and hematuria.  Musculoskeletal: Positive for back pain.  Skin: Negative for rash.  Neurological: Negative for seizures and headaches.  Psychiatric/Behavioral: Negative for hallucinations.     Physical Exam Updated Vital Signs BP (!) 137/102   Pulse 68   Temp 98.3 F (36.8 C) (Oral)   Resp 18   Ht 5\' 6"  (1.676 m)   Wt 79.4 kg   SpO2 99%   BMI 28.25 kg/m   Physical Exam Vitals signs and nursing note reviewed.  Constitutional:      Appearance: She is well-developed.  HENT:     Head: Normocephalic.     Nose: Nose normal.  Eyes:     General: No scleral icterus.    Conjunctiva/sclera: Conjunctivae normal.  Neck:     Musculoskeletal: Neck supple.     Thyroid: No thyromegaly.  Cardiovascular:     Rate and Rhythm: Normal rate and regular rhythm.     Heart sounds: No murmur. No friction rub. No gallop.   Pulmonary:     Breath sounds: No stridor.  No wheezing or rales.  Chest:     Chest wall: No tenderness.  Abdominal:     General: There is no distension.     Tenderness: There is no abdominal tenderness. There is no rebound.  Musculoskeletal: Normal range of motion.  Lymphadenopathy:     Cervical: No cervical adenopathy.  Skin:    Findings: No erythema or rash.  Neurological:     Mental Status: She is oriented to  person, place, and time.     Motor: No abnormal muscle tone.     Coordination: Coordination normal.  Psychiatric:        Behavior: Behavior normal.      ED Treatments / Results  Labs (all labs ordered are listed, but only abnormal results are displayed) Labs Reviewed  BASIC METABOLIC PANEL - Abnormal; Notable for the following components:      Result Value   Glucose, Bld 103 (*)    Creatinine, Ser 1.07 (*)    GFR calc non Af Amer 48 (*)    GFR calc Af Amer 56 (*)    All other components within normal limits  TROPONIN I - Abnormal; Notable for the following components:   Troponin I 0.04 (*)    All other components within normal limits  URINALYSIS, ROUTINE W REFLEX MICROSCOPIC - Abnormal; Notable for the following components:   Color, Urine STRAW (*)    All other components within normal limits  D-DIMER, QUANTITATIVE (NOT AT [facility]) - Abnormal; Notable for the following components:   D-Dimer, Quant 1.04 (*)    All other components within normal limits  CBC  DIFFERENTIAL  HEPATIC FUNCTION PANEL    EKG EKG Interpretation  Date/Time:  Sunday December 04 2018 17:04:32 EST Ventricular Rate:  77 PR Interval:  186 QRS Duration: 130 QT Interval:  426 QTC Calculation: 482 R Axis:   105 Text Interpretation:  Normal sinus rhythm Rightward axis Non-specific intra-ventricular conduction block Cannot rule out Anteroseptal infarct , age undetermined Abnormal ECG Confirmed by Bethann BerkshireZammit, Jilian West [phone]([id number]) on 12/04/2018 9:17:14 PM   Radiology Dg Chest 2 View  Result Date: 12/04/2018 CLINICAL DATA:  Left flank and rib  pain. EXAM: CHEST - 2 VIEW COMPARISON:  June 15, 2017 FINDINGS: The heart size and mediastinal contours are within normal limits. Both lungs are clear. The visualized skeletal structures are unremarkable. IMPRESSION: No active cardiopulmonary disease. Electronically Signed   By: Gerome Samavid  Williams III M.D   On: 12/04/2018 18:17   Ct Angio Chest Pe W And/or Wo Contrast  Result Date: 12/04/2018 CLINICAL DATA:  Dizziness. Midsternal chest pain. Left flank pain and left rib pain. EXAM: CT ANGIOGRAPHY CHEST WITH CONTRAST TECHNIQUE: Multidetector CT imaging of the chest was performed using the standard protocol during bolus administration of intravenous contrast. Multiplanar CT image reconstructions and MIPs were obtained to evaluate the vascular anatomy. CONTRAST:  100mL ISOVUE-370 IOPAMIDOL (ISOVUE-370) INJECTION 76% COMPARISON:  Chest radiograph 12/04/2018, chest CT 06/15/2017 FINDINGS: Cardiovascular: Mildly enlarged heart. Calcific atherosclerotic disease of the aorta and coronary arteries. No evidence of central pulmonary embolus. Mediastinum/Nodes: No enlarged mediastinal, hilar, or axillary lymph nodes. Thyroid gland, trachea, and esophagus demonstrate no significant findings. Lungs/Pleura: 6 mm right upper lobe subpleural nodule, image 34/140, sequence 6. Mild peribronchial thickening in the right upper lobe, likely chronic bronchitic changes. No evidence of lobar consolidation, pleural effusion or pneumothorax. Atelectatic changes in the lingula. Upper Abdomen: Partially visualized benign-appearing 2.6 cm left renal cyst. Musculoskeletal: Findings of diffuse idiopathic skeletal hyperostosis of the thoracic spine. No suspicious osseous lesions or acute fractures. Review of the MIP images confirms the above findings. IMPRESSION: Mildly enlarged heart. Calcific atherosclerotic disease of the aorta and coronary arteries. 6 mm right upper lobe soft tissue pulmonary nodule. Non-contrast chest CT at 6-12 months is  recommended. If the nodule is stable at time of repeat CT, then future CT at 18-24 months (from today's scan) is considered optional for low-risk patients, but is recommended for high-risk  patients. This recommendation follows the consensus statement: Guidelines for Management of Incidental Pulmonary Nodules Detected on CT Images: From the Fleischner Society 2017; Radiology 2017; 284:228-243. Aortic Atherosclerosis (ICD10-I70.0). Electronically Signed   By: Ted Mcalpine M.D.   On: 12/04/2018 21:05    Procedures Procedures (including critical care time)  Medications Ordered in ED Medications  sodium chloride flush (NS) 0.9 % injection 3 mL (3 mLs Intravenous Not Given 12/04/18 1739)  iopamidol (ISOVUE-370) 76 % injection 100 mL (100 mLs Intravenous Contrast Given 12/04/18 1957)  aspirin tablet 325 mg (325 mg Oral Given 12/04/18 2132)  HYDROcodone-acetaminophen (NORCO/VICODIN) 5-325 MG per tablet 1 tablet (1 tablet Oral Given 12/04/18 2132)     Initial Impression / Assessment and Plan / ED Course  I have reviewed the triage vital signs and the nursing notes.  Pertinent labs & imaging results that were available during my care of the patient were reviewed by me and considered in my medical decision making (see chart for details).    Patient with chest pain and mildly elevated D dimer.  CT angios was negative.  She also has elevated troponin.  She will be admitted to medicine for chest pain rule out MI  Final Clinical Impressions(s) / ED Diagnoses   Final diagnoses:  Chest pain in adult    ED Discharge Orders    None       Bethann Berkshire, MD 12/04/18 2139

## 2018-12-05 ENCOUNTER — Observation Stay (HOSPITAL_BASED_OUTPATIENT_CLINIC_OR_DEPARTMENT_OTHER): Payer: Medicare PPO

## 2018-12-05 DIAGNOSIS — R079 Chest pain, unspecified: Secondary | ICD-10-CM | POA: Diagnosis not present

## 2018-12-05 DIAGNOSIS — E782 Mixed hyperlipidemia: Secondary | ICD-10-CM | POA: Diagnosis not present

## 2018-12-05 DIAGNOSIS — R072 Precordial pain: Secondary | ICD-10-CM

## 2018-12-05 DIAGNOSIS — E1165 Type 2 diabetes mellitus with hyperglycemia: Secondary | ICD-10-CM

## 2018-12-05 DIAGNOSIS — R0789 Other chest pain: Secondary | ICD-10-CM | POA: Diagnosis not present

## 2018-12-05 DIAGNOSIS — I10 Essential (primary) hypertension: Secondary | ICD-10-CM | POA: Diagnosis not present

## 2018-12-05 DIAGNOSIS — E785 Hyperlipidemia, unspecified: Secondary | ICD-10-CM | POA: Diagnosis not present

## 2018-12-05 DIAGNOSIS — R911 Solitary pulmonary nodule: Secondary | ICD-10-CM

## 2018-12-05 DIAGNOSIS — I4510 Unspecified right bundle-branch block: Secondary | ICD-10-CM

## 2018-12-05 LAB — GLUCOSE, CAPILLARY
Glucose-Capillary: 109 mg/dL — ABNORMAL HIGH (ref 70–99)
Glucose-Capillary: 154 mg/dL — ABNORMAL HIGH (ref 70–99)
Glucose-Capillary: 99 mg/dL (ref 70–99)

## 2018-12-05 LAB — NM MYOCAR MULTI W/SPECT W/WALL MOTION / EF
CHL CUP NUCLEAR SSS: 10
LV dias vol: 47 mL (ref 46–106)
LV sys vol: 8 mL
Peak HR: 86 {beats}/min
RATE: 0.52
Rest HR: 64 {beats}/min
SDS: 3
SRS: 7
TID: 1.11

## 2018-12-05 LAB — ECHOCARDIOGRAM COMPLETE
HEIGHTINCHES: 66 in
Weight: 2663.16 oz

## 2018-12-05 LAB — TROPONIN I
TROPONIN I: 0.04 ng/mL — AB (ref <0.03)
Troponin I: 0.04 ng/mL (ref <0.03)

## 2018-12-05 MED ORDER — SODIUM CHLORIDE 0.9% FLUSH
INTRAVENOUS | Status: AC
  Administered 2018-12-05: 10 mL via INTRAVENOUS
  Filled 2018-12-05: qty 10

## 2018-12-05 MED ORDER — REGADENOSON 0.4 MG/5ML IV SOLN
INTRAVENOUS | Status: AC
  Administered 2018-12-05: 0.4 mg via INTRAVENOUS
  Filled 2018-12-05: qty 5

## 2018-12-05 MED ORDER — TECHNETIUM TC 99M TETROFOSMIN IV KIT
10.0000 | PACK | Freq: Once | INTRAVENOUS | Status: AC | PRN
  Administered 2018-12-05: 11 via INTRAVENOUS

## 2018-12-05 MED ORDER — TECHNETIUM TC 99M TETROFOSMIN IV KIT
30.0000 | PACK | Freq: Once | INTRAVENOUS | Status: AC | PRN
  Administered 2018-12-05: 27 via INTRAVENOUS

## 2018-12-05 MED ORDER — ASPIRIN 81 MG PO TBEC: 81.0000 mg | DELAYED_RELEASE_TABLET | Freq: Every day | ORAL | 1 refills | Status: DC

## 2018-12-05 MED ORDER — REGADENOSON 0.4 MG/5ML IV SOLN
0.4000 mg | Freq: Once | INTRAVENOUS | Status: DC
  Filled 2018-12-05: qty 5

## 2018-12-05 MED ORDER — ACETAMINOPHEN 325 MG PO TABS: 650.0000 mg | ORAL_TABLET | Freq: Four times a day (QID) | ORAL | 0 refills | Status: DC | PRN

## 2018-12-05 NOTE — Care Management Obs Status (Signed)
MEDICARE OBSERVATION STATUS NOTIFICATION   Patient Details  Name: Haley Nguyen MRN: [id number] Date of Birth: 08-29-1933   Medicare Observation Status Notification Given:  Yes    Renie Ora 12/05/2018, 2:10 PM

## 2018-12-05 NOTE — Discharge Planning (Signed)
Patient IV removed.  RN assessment and VS revealed stability for DC to home.  Discharge papers given, explained and educated.  Informed of suggested FU appt and appt made.  Scripts e-scribed to [facility] in [place], [place] (per patient request).  Once ready, will be wheeled to front and family transporting home via car.

## 2018-12-05 NOTE — Consult Note (Addendum)
Cardiology Consultation:   Patient ID: HOUSTON SURGES MRN: [id number]; DOB: Jul 05, 1933  Admit date: 12/04/2018 Date of Consult: 12/05/2018  Primary Care Provider: Smith Robert, MD Primary Cardiologist: No primary care provider on file. Koneswaran Primary Electrophysiologist:  None     Patient Profile:   Haley Nguyen is a 85 y.o. female with a hx of atypical chest pain who is being seen today for the evaluation of chest pain at the request of Dr. Mariea Clonts.  History of Present Illness:   Haley Nguyen is an 85 year old female patient with history of hypertension, DM type II, chronic right bundle branch block,chronically elevated troponins of 0.04.  She was seen in 2017 by Dr. Diona Browner for atypical chest pain and had a normal 2D echo and no further cardiac work-up was pursued.  Patient was admitted yesterday with left-sided flank pain radiating under her left breast.  Also chronic dizziness for the past several months.  D-dimer was elevated at 1.04 and CT showed anterior chest right upper lobe soft tissue pulmonary nodule.  Patient describes the feeling of a bubble under her left breast that wouldn't go away. Says she walks every morning and the bubble gets bigger and she has to stop and it goes away. Comes more frequently now and getting worse. Difficult historian-some dementia. Remote smoker & thinks parents had heart disease but they were in their 54's when they died.  Past Medical History:  Diagnosis Date  . Arthritis   . Bilateral renal cysts 06/16/2017  . CAP (community acquired pneumonia) 06/15/2017  . DDD (degenerative disc disease), lumbar   . Dementia (HCC)   . Diabetes mellitus without complication (HCC)   . Diverticulosis   . Essential hypertension   . Right bundle branch block 06/16/2017  . RLQ abdominal pain 05/29/2013  . Spinal stenosis at L4-L5 level 06/16/2017    Past Surgical History:  Procedure Laterality Date  . ABDOMINAL HYSTERECTOMY    . INNER EAR  SURGERY     "had ear surgery years ago and there's metal left in there"  . LAPAROSCOPIC LYSIS OF ADHESIONS N/A 03/02/2013   Procedure: LAPAROSCOPIC LYSIS OF ADHESIONS;  Surgeon: Lazaro Arms, MD;  Location: [facility];  Service: Gynecology;  Laterality: N/A;  . LAPAROSCOPIC SALPINGO OOPHERECTOMY Bilateral 03/02/2013   Procedure: LAPAROSCOPIC SALPINGO OOPHORECTOMY;  Surgeon: Lazaro Arms, MD;  Location: [facility];  Service: Gynecology;  Laterality: Bilateral;     Home Medications:  Prior to Admission medications   Medication Sig Start Date End Date Taking? Authorizing Provider  amLODipine (NORVASC) 10 MG tablet Take 10 mg by mouth every morning.    Yes [provider]  docusate sodium (COLACE) 100 MG capsule Take 200 mg by mouth 2 (two) times daily as needed for mild constipation or moderate constipation.    Yes [provider]  famotidine (PEPCID) 20 MG tablet Take 1 tablet (20 mg total) by mouth daily. 06/19/17  Yes Elliot Cousin, MD  metoprolol (LOPRESSOR) 50 MG tablet Take 50 mg by mouth 2 (two) times daily.   Yes [provider]  Omega-3 Fatty Acids (FISH OIL) 1000 MG CAPS Take 1 capsule by mouth daily.   Yes [provider]  Psyllium (FIBER) 0.52 g CAPS Take 2 capsules by mouth 2 (two) times daily.   Yes [provider]  QUEtiapine (SEROQUEL) 100 MG tablet Take 1 tablet by mouth at bedtime. 11/25/18  Yes [provider]  simvastatin (ZOCOR) 40 MG tablet Take 20 mg by mouth every  evening.   Yes [provider]  sitaGLIPtin (JANUVIA) 50 MG tablet Take 50 mg by mouth every morning.   Yes [provider]  traMADol (ULTRAM) 50 MG tablet Take 50 mg by mouth every 6 (six) hours as needed for pain.   Yes [provider]  ALPRAZolam (XANAX) 0.25 MG tablet Take 1 tablet (0.25 mg total) by mouth 3 (three) times daily as needed for anxiety. 07/19/18   Loren RacerYelverton, David, MD  metroNIDAZOLE (FLAGYL) 500 MG tablet Take 500 mg by mouth  3 (three) times daily. 06/30/18   [provider]    Inpatient Medications: Scheduled Meds: . amLODipine  10 mg Oral Daily  . aspirin EC  81 mg Oral Daily  . enoxaparin (LOVENOX) injection  40 mg Subcutaneous Q24H  . famotidine  20 mg Oral Daily  . insulin aspart  0-9 Units Subcutaneous TID WC  . linagliptin  5 mg Oral Daily  . metoprolol tartrate  50 mg Oral BID  . QUEtiapine  100 mg Oral QHS  . simvastatin  20 mg Oral QPM   Continuous Infusions: . sodium chloride 100 mL/hr at 12/05/18 0008   PRN Meds: acetaminophen **OR** acetaminophen, ondansetron **OR** ondansetron (ZOFRAN) IV, polyethylene glycol, traMADol  Allergies:    Allergies  Allergen Reactions  . Ibuprofen     Messes with kidneys     Social History:   Social History   Socioeconomic History  . Marital status: Widowed    Spouse name: Not on file  . Number of children: Not on file  . Years of education: Not on file  . Highest education level: Not on file  Occupational History  . Not on file  Social Needs  . Financial resource strain: Patient refused  . Food insecurity:    Worry: Patient refused    Inability: Patient refused  . Transportation needs:    Medical: Patient refused    Non-medical: Patient refused  Tobacco Use  . Smoking status: Former Smoker    Years: 30.00    Types: Cigarettes  . Smokeless tobacco: Former NeurosurgeonUser    Types: Chew, Snuff  Substance and Sexual Activity  . Alcohol use: Not Currently    Comment: occasional drink  . Drug use: No  . Sexual activity: Never  Lifestyle  . Physical activity:    Days per week: Patient refused    Minutes per session: Patient refused  . Stress: Patient refused  Relationships  . Social connections:    Talks on phone: Patient refused    Gets together: Patient refused    Attends religious service: Patient refused    Active member of club or organization: Patient refused    Attends meetings of clubs or organizations: Patient refused     Relationship status: Patient refused  . Intimate partner violence:    Fear of current or ex partner: Patient refused    Emotionally abused: Patient refused    Physically abused: Patient refused    Forced sexual activity: Patient refused  Other Topics Concern  . Not on file  Social History Narrative  . Not on file    Family History:    Family History  Problem Relation Age of Onset  . Heart disease Father   . Heart disease Mother      ROS:  Please see the history of present illness.  Review of Systems  Reason unable to perform ROS: dementia.    All other ROS reviewed and negative.     Physical Exam/Data:  Vitals:   12/04/18 2130 12/04/18 2200 12/04/18 2245 12/05/18 0512  BP: (!) 158/75 (!) 158/72 (!) 155/66 (!) 122/56  Pulse: 75 78 77 (!) 58  Resp: 18 (!) 23 18 20   Temp:   98.4 F (36.9 C) 98.4 F (36.9 C)  TempSrc:   Oral Oral  SpO2: 99% 97% 100% 96%  Weight:   75.5 kg   Height:   5\' 6"  (1.676 m)     Intake/Output Summary (Last 24 hours) at 12/05/2018 0815 Last data filed at 12/05/2018 0548 Gross per 24 hour  Intake 557.12 ml  Output -  Net 557.12 ml   Last 3 Weights 12/04/2018 12/04/2018 07/19/2018  Weight (lbs) 166 lb 7.2 oz 175 lb 165 lb  Weight (kg) 75.5 kg 79.379 kg 74.844 kg     Body mass index is 26.87 kg/m.  General:  Well nourished, well developed, in no acute distress  HEENT: normal Lymph: no adenopathy Neck: no JVD Endocrine:  No thryomegaly Vascular: No carotid bruits; FA pulses 2+ bilaterally without bruits  Cardiac:  normal S1, S2; RRR; no murmur  Lungs:  clear to auscultation bilaterally, no wheezing, rhonchi or rales  Abd: soft, nontender, no hepatomegaly  Ext: no edema Musculoskeletal:  No deformities, BUE and BLE strength normal and equal Skin: warm and dry  Neuro:  CNs 2-12 intact, no focal abnormalities noted Psych:  Normal affect   EKG:  The EKG was personally reviewed and demonstrates:  NSR with incomplete RBBB and TWI inflat  that is more prominent on todays EKG  Telemetry:  Telemetry was personally reviewed and demonstrates:  Sinus bradycardia   Relevant CV Studies: 2D echo 8/8/2018Study Conclusions   - Left ventricle: The cavity size was normal. Wall thickness was   increased in a pattern of mild LVH. Systolic function was normal.   The estimated ejection fraction was in the range of 60% to 65%.   Wall motion was normal; there were no regional wall motion   abnormalities. Doppler parameters are consistent with abnormal   left ventricular relaxation (grade 1 diastolic dysfunction). - Aortic valve: Valve area (VTI): 3.14 cm^2. Valve area (Vmax):   3.08 cm^2. Valve area (Vmean): 2.81 cm^2. - Technically adequate study.   Laboratory Data:  Chemistry Recent Labs  Lab 12/04/18 1748  NA 136  K 4.1  CL 101  CO2 26  GLUCOSE 103*  BUN 16  CREATININE 1.07*  CALCIUM 9.3  GFRNONAA 48*  GFRAA 56*  ANIONGAP 9    Recent Labs  Lab 12/04/18 1748  PROT 8.1  ALBUMIN 4.1  AST 24  ALT 18  ALKPHOS 81  BILITOT 0.5   Hematology Recent Labs  Lab 12/04/18 1748  WBC 6.7  RBC 4.94  HGB 14.3  HCT 44.2  MCV 89.5  MCH 28.9  MCHC 32.4  RDW 12.6  PLT 320   Cardiac Enzymes Recent Labs  Lab 12/04/18 1748 12/04/18 2344 12/05/18 0501  TROPONINI 0.04* 0.04* 0.04*   No results for input(s): TROPIPOC in the last 168 hours.  BNPNo results for input(s): BNP, PROBNP in the last 168 hours.  DDimer  Recent Labs  Lab 12/04/18 1748  DDIMER 1.04*    Radiology/Studies:  Dg Chest 2 View  Result Date: 12/04/2018 CLINICAL DATA:  Left flank and rib pain. EXAM: CHEST - 2 VIEW COMPARISON:  June 15, 2017 FINDINGS: The heart size and mediastinal contours are within normal limits. Both lungs are clear. The visualized skeletal structures are unremarkable. IMPRESSION: No active cardiopulmonary  disease. Electronically Signed   By: Gerome Sam III M.D   On: 12/04/2018 18:17   Dg Thoracic Spine 2 View  Result  Date: 12/04/2018 CLINICAL DATA:  Left flank and rib pain EXAM: THORACIC SPINE 2 VIEWS COMPARISON:  CT 12/04/2018, chest x-ray 12/04/2018, 06/15/2017 FINDINGS: Vertebral body heights are maintained. Degenerative osteophytes of the thoracic spine. Minimal scoliosis. IMPRESSION: Minimal scoliosis and degenerative changes. No acute osseous abnormality Electronically Signed   By: Jasmine Pang M.D.   On: 12/04/2018 22:35   Ct Angio Chest Pe W And/or Wo Contrast  Result Date: 12/04/2018 CLINICAL DATA:  Dizziness. Midsternal chest pain. Left flank pain and left rib pain. EXAM: CT ANGIOGRAPHY CHEST WITH CONTRAST TECHNIQUE: Multidetector CT imaging of the chest was performed using the standard protocol during bolus administration of intravenous contrast. Multiplanar CT image reconstructions and MIPs were obtained to evaluate the vascular anatomy. CONTRAST:  ISOVUE-370 IOPAMIDOL (ISOVUE-370) INJECTION 76% COMPARISON:  Chest radiograph 12/04/2018, chest CT 06/15/2017 FINDINGS: Cardiovascular: Mildly enlarged heart. Calcific atherosclerotic disease of the aorta and coronary arteries. No evidence of central pulmonary embolus. Mediastinum/Nodes: No enlarged mediastinal, hilar, or axillary lymph nodes. Thyroid gland, trachea, and esophagus demonstrate no significant findings. Lungs/Pleura: 6 mm right upper lobe subpleural nodule, image 34/140, sequence 6. Mild peribronchial thickening in the right upper lobe, likely chronic bronchitic changes. No evidence of lobar consolidation, pleural effusion or pneumothorax. Atelectatic changes in the lingula. Upper Abdomen: Partially visualized benign-appearing 2.6 cm left renal cyst. Musculoskeletal: Findings of diffuse idiopathic skeletal hyperostosis of the thoracic spine. No suspicious osseous lesions or acute fractures. Review of the MIP images confirms the above findings. IMPRESSION: Mildly enlarged heart. Calcific atherosclerotic disease of the aorta and coronary arteries. 6  mm right upper lobe soft tissue pulmonary nodule. Non-contrast chest CT at 6-12 months is recommended. If the nodule is stable at time of repeat CT, then future CT at 18-24 months (from today's scan) is considered optional for low-risk patients, but is recommended for high-risk patients. This recommendation follows the consensus statement: Guidelines for Management of Incidental Pulmonary Nodules Detected on CT Images: From the Fleischner Society 2017; Radiology 2017; 284:228-243. Aortic Atherosclerosis (ICD10-I70.0). Electronically Signed   By: Ted Mcalpine M.D.   On: 12/04/2018 21:05    Assessment and Plan:   1. Chest pain with chronically elevated but flat troponins at 0.04, normal LV function LVEF 60 to 65% on echo 06/2017 no wall motion abnormality. More prominent TWI on EKG. Describes exertional chest tightness. Will do lexiscan myoview today. She's NPO. 2. Dizziness-sounds orthostatic-will check-getting IV fluids at 100cc/hr 3. Chronic RBBB 4. Essential hypertension on metoprolol and amlodipine 5. Hyperlipidemia on simvastatin 6. Family history of CAD 7. Dementia 8. Pulmonary nodule 6 mm right upper lobe needs repeat CT in 6 months 9. DM type II      For questions or updates, please contact [facility] Please consult [email or web address] for contact info under     Signed, Jacolyn Reedy, PA-C  12/05/2018 8:15 AM   The patient was seen and examined, and I agree with the history, physical exam, assessment and plan as documented above, with modifications as noted below. I have also personally reviewed all relevant documentation, old records, labs, and both radiographic and cardiovascular studies. I have also independently interpreted old and new ECG's.  Briefly, this is an 85 year old woman with somewhat of a poor historian and tells me she has intermittent chest pains for years "which feel like a bubble inside the chest ".  She had similar symptoms in August 2018 and underwent an  echocardiogram which demonstrated normal left ventricular systolic function and regional wall motion, EF 60-65%.  D-dimer was mildly elevated and she underwent a series of radiographic images.  Thoracic spine x-ray showed minimal scoliosis and degenerative changes with no acute osseous abnormalities.  Chest x-ray was unremarkable.  CT angiography of the chest mention "calcific atherosclerotic disease of the aorta and coronary arteries ".  There was no pulmonary embolism.  There was some cardiomegaly and a 6 mm right upper lobe soft tissue pulmonary nodule.  Troponins have been nonspecifically elevated at 0.043.  ECG shows sinus rhythm with right bundle branch block which is chronic.  T wave inversions showed no significant changes.  She is on amlodipine, metoprolol, and simvastatin as an outpatient.  We will proceed with a Lexiscan Myoview stress test to evaluate for ischemic heart disease.    Prentice Docker, MD, [facility]  12/05/2018 9:10 AM

## 2018-12-05 NOTE — Progress Notes (Signed)
*  PRELIMINARY RESULTS* Echocardiogram 2D Echocardiogram has been performed.  Haley Nguyen 12/05/2018, 3:17 PM

## 2018-12-05 NOTE — Discharge Summary (Signed)
Physician Discharge Summary  Haley Nguyen GQQ:[id number] DOB: Apr 06, 1933 DOA: 12/04/2018  PCP: Smith Robert, MD  Admit date: 12/04/2018 Discharge date: 12/05/2018  Time spent: 30 minutes  Recommendations for Outpatient Follow-up:  1. Repeat basic metabolic panel to follow electrolytes and renal function 2. Reassess blood pressure and further adjust antihypertensive regimen as needed 3. Please repeat CT chest in 6-14-month to further follow pulmonary nodule as recommended.   Discharge Diagnoses:  Chest pain in adult Type 2 diabetes mellitus with hyperglycemia, without long-term current use of insulin (HCC) Essential hypertension Hyperlipidemia Depression/anxiety Pulmonary nodule   Discharge Condition: Stable and improved.  Patient discharged home with instructions to follow-up with PCP in 10 days.  Diet recommendation: Heart healthy and modified carbohydrates diet.  Filed Weights   12/04/18 1658 12/04/18 2245  Weight: 79.4 kg 75.5 kg    History of present illness:  As per H&P written by Dr. Mariea Clonts on 12/04/2018 85 y.o. female with medical history significant for DM2, HTN, RBBB, who presented to the ED with complaints of left-sided flank pain radiating anteriorly underneath her left breast.  No associated difficulty breathing.  Daughter is present at bedside and helps with some of the history due to the patient's baseline cognitive deficits.  Daughter reports chronic dizziness for months.  Patient also has chronic lower back issues for which she gets spinal injections intermittently.  ED Course: Stable vitals.  Unremarkable CBC CMP, troponin elevated 0.04 but chronically elevated.  Elevated d-dimer 1.04.  Subsequent CT anterior chest-right upper lobe soft tissue pulmonary nodule. Aspirin given, hopsitalist to admit for chest pain rule out ACS.  Patient at this time is chest pain free s/p Norco.   Hospital Course:  Pain chest pain -Mild troponin elevation 0.04 (flat  elevation and unchanged x3). -Heart score 4-5 -Elevated d-dimer with subsequent CT angiogram that was negative for pulmonary embolism. -Telemetry without acute ischemic changes and EKG demonstrating only all right bundle branch block. -Following cardiology recommendations patient had a stress test that demonstrated to be low risk factor And a reassuring echocardiogram with moderate LVH, preserved ejection fraction 60-65% and no wall motion abnormalities.  Grade 1 diastolic dysfunction was appreciated. -Continue aspirin, beta-blocker and statins.  2-hypertension -Stable and well-controlled -Continue home antihypertensive regimen -Patient advised to follow heart healthy diet.  3-chronic diastolic heart failure -Compensated -Follow daily weights and low-sodium diet. -Continue blood pressure control.  4-hyperlipidemia -Will continue statins  5-gastroesophageal reflux disease -Continue famotidine.  6-depression/anxiety -Mood stable -Continue Seroquel   7-type 2 diabetes mellitus -Follow modified carbohydrate diet and resume oral hypoglycemic agents.   Procedures:  See below for x-ray reports  2D echo: Grade 1 diastolic dysfunction; no wall motion and normalities, preserved ejection fraction and moderate LVH.  Lexiscan Myoview: Low risk  Consultations:  Cardiology service  Discharge Exam: Vitals:   12/05/18 1208 12/05/18 1209  BP: 126/62   Pulse:  64  Resp:    Temp:    SpO2:      General: Currently chest pain-free, denies shortness of breath, no nausea, no vomiting, no acute distress. Cardiovascular: S1 and S2, no rubs, no gallops, no JVD on exam. Respiratory: Good air movement bilaterally, no wheezing, no crackles. Abdomen: Soft, nontender, nondistended, positive bowel sounds Extremities: No edema, no cyanosis, no clubbing.  Discharge Instructions   Discharge Instructions    Diet - low sodium heart healthy   Complete by:  As directed    Discharge  instructions   Complete by:  As directed  Keep yourself well-hydrated Take medications as prescribed Follow heart healthy diet Arrange follow-up with PCP in 10 days.     Allergies as of 12/05/2018      Reactions   Ibuprofen    Messes with kidneys      Medication List    STOP taking these medications   ALPRAZolam 0.25 MG tablet Commonly known as:  XANAX   metroNIDAZOLE 500 MG tablet Commonly known as:  FLAGYL     TAKE these medications   acetaminophen 325 MG tablet Commonly known as:  TYLENOL Take 2 tablets (650 mg total) by mouth every 6 (six) hours as needed for mild pain (or Fever >/= 101).   amLODipine 10 MG tablet Commonly known as:  NORVASC Take 10 mg by mouth every morning.   aspirin 81 MG EC tablet Take 1 tablet (81 mg total) by mouth daily. Start taking on:  December 06, 2018   docusate sodium 100 MG capsule Commonly known as:  COLACE Take 200 mg by mouth 2 (two) times daily as needed for mild constipation or moderate constipation.   famotidine 20 MG tablet Commonly known as:  PEPCID Take 1 tablet (20 mg total) by mouth daily.   Fiber 0.52 g Caps Take 2 capsules by mouth 2 (two) times daily.   Fish Oil 1000 MG Caps Take 1 capsule by mouth daily.   metoprolol tartrate 50 MG tablet Commonly known as:  LOPRESSOR Take 50 mg by mouth 2 (two) times daily.   QUEtiapine 100 MG tablet Commonly known as:  SEROQUEL Take 1 tablet by mouth at bedtime.   simvastatin 40 MG tablet Commonly known as:  ZOCOR Take 20 mg by mouth every evening.   sitaGLIPtin 50 MG tablet Commonly known as:  JANUVIA Take 50 mg by mouth every morning.   traMADol 50 MG tablet Commonly known as:  ULTRAM Take 50 mg by mouth every 6 (six) hours as needed for pain.      Allergies  Allergen Reactions  . Ibuprofen     Messes with kidneys    Follow-up Information    Smith RobertKikel, Stephen, MD. Schedule an appointment as soon as possible for a visit in 10 day(s).   Specialty:  Family  Medicine Contact information: [address] [place] [address] [phone]            The results of significant diagnostics from this hospitalization (including imaging, microbiology, ancillary and laboratory) are listed below for reference.    Significant Diagnostic Studies: Dg Chest 2 View  Result Date: 12/04/2018 CLINICAL DATA:  Left flank and rib pain. EXAM: CHEST - 2 VIEW COMPARISON:  June 15, 2017 FINDINGS: The heart size and mediastinal contours are within normal limits. Both lungs are clear. The visualized skeletal structures are unremarkable. IMPRESSION: No active cardiopulmonary disease. Electronically Signed   By: Gerome Samavid  Williams III M.D   On: 12/04/2018 18:17   Dg Thoracic Spine 2 View  Result Date: 12/04/2018 CLINICAL DATA:  Left flank and rib pain EXAM: THORACIC SPINE 2 VIEWS COMPARISON:  CT 12/04/2018, chest x-ray 12/04/2018, 06/15/2017 FINDINGS: Vertebral body heights are maintained. Degenerative osteophytes of the thoracic spine. Minimal scoliosis. IMPRESSION: Minimal scoliosis and degenerative changes. No acute osseous abnormality Electronically Signed   By: Jasmine PangKim  Fujinaga M.D.   On: 12/04/2018 22:35   Ct Angio Chest Pe W And/or Wo Contrast  Result Date: 12/04/2018 CLINICAL DATA:  Dizziness. Midsternal chest pain. Left flank pain and left rib pain. EXAM: CT ANGIOGRAPHY CHEST WITH  CONTRAST TECHNIQUE: Multidetector CT imaging of the chest was performed using the standard protocol during bolus administration of intravenous contrast. Multiplanar CT image reconstructions and MIPs were obtained to evaluate the vascular anatomy. CONTRAST:  100mL ISOVUE-370 IOPAMIDOL (ISOVUE-370) INJECTION 76% COMPARISON:  Chest radiograph 12/04/2018, chest CT 06/15/2017 FINDINGS: Cardiovascular: Mildly enlarged heart. Calcific atherosclerotic disease of the aorta and coronary arteries. No evidence of central pulmonary embolus. Mediastinum/Nodes: No enlarged mediastinal, hilar, or axillary  lymph nodes. Thyroid gland, trachea, and esophagus demonstrate no significant findings. Lungs/Pleura: 6 mm right upper lobe subpleural nodule, image 34/140, sequence 6. Mild peribronchial thickening in the right upper lobe, likely chronic bronchitic changes. No evidence of lobar consolidation, pleural effusion or pneumothorax. Atelectatic changes in the lingula. Upper Abdomen: Partially visualized benign-appearing 2.6 cm left renal cyst. Musculoskeletal: Findings of diffuse idiopathic skeletal hyperostosis of the thoracic spine. No suspicious osseous lesions or acute fractures. Review of the MIP images confirms the above findings. IMPRESSION: Mildly enlarged heart. Calcific atherosclerotic disease of the aorta and coronary arteries. 6 mm right upper lobe soft tissue pulmonary nodule. Non-contrast chest CT at 6-12 months is recommended. If the nodule is stable at time of repeat CT, then future CT at 18-24 months (from today's scan) is considered optional for low-risk patients, but is recommended for high-risk patients. This recommendation follows the consensus statement: Guidelines for Management of Incidental Pulmonary Nodules Detected on CT Images: From the Fleischner Society 2017; Radiology 2017; 284:228-243. Aortic Atherosclerosis (ICD10-I70.0). Electronically Signed   By: Ted Mcalpineobrinka  Dimitrova M.D.   On: 12/04/2018 21:05   Nm Myocar Multi W/spect W/wall Motion / Ef  Result Date: 12/05/2018  RBBB and T wave inversions inferiorly and V4-6 seen at rest and throughout study. No arrhythmias.  Defect 1: There is a medium defect of mild severity present in the mid inferolateral and apical lateral location. This appears to be due to soft tissue attenuation. Regional wall motion is normal.  This is a low risk study. No definitive ischemic territories.  Nuclear stress EF: 83%.     Microbiology: No results found for this or any previous visit (from the past 240 hour(s)).   Labs: Basic Metabolic Panel: Recent  Labs  Lab 12/04/18 1748  NA 136  K 4.1  CL 101  CO2 26  GLUCOSE 103*  BUN 16  CREATININE 1.07*  CALCIUM 9.3   Liver Function Tests: Recent Labs  Lab 12/04/18 1748  AST 24  ALT 18  ALKPHOS 81  BILITOT 0.5  PROT 8.1  ALBUMIN 4.1   CBC: Recent Labs  Lab 12/04/18 1748  WBC 6.7  NEUTROABS 3.2  HGB 14.3  HCT 44.2  MCV 89.5  PLT 320   Cardiac Enzymes: Recent Labs  Lab 12/04/18 1748 12/04/18 2344 12/05/18 0501  TROPONINI 0.04* 0.04* 0.04*   CBG: Recent Labs  Lab 12/04/18 2247 12/05/18 0740 12/05/18 1132  GLUCAP 111* 109* 154*    Signed:  Vassie Lollarlos Theia Dezeeuw MD.  [facility] 12/05/2018, 3:46 PM

## 2019-05-29 ENCOUNTER — Telehealth (INDEPENDENT_AMBULATORY_CARE_PROVIDER_SITE_OTHER): Payer: Medicare PPO | Admitting: Neurology

## 2019-05-29 ENCOUNTER — Encounter: Payer: Self-pay | Admitting: Neurology

## 2019-05-29 ENCOUNTER — Other Ambulatory Visit: Payer: Self-pay

## 2019-05-29 VITALS — Ht 66.0 in | Wt 160.0 lb

## 2019-05-29 DIAGNOSIS — F03B18 Unspecified dementia, moderate, with other behavioral disturbance: Secondary | ICD-10-CM

## 2019-05-29 DIAGNOSIS — F0391 Unspecified dementia with behavioral disturbance: Secondary | ICD-10-CM

## 2019-05-29 MED ORDER — TRAZODONE HCL 50 MG PO TABS: ORAL_TABLET | ORAL | 11 refills | Status: AC
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
## 2019-07-24 ENCOUNTER — Other Ambulatory Visit: Payer: Self-pay

## 2019-07-24 ENCOUNTER — Encounter (HOSPITAL_COMMUNITY): Payer: Self-pay

## 2019-07-24 DIAGNOSIS — Y92000 Kitchen of unspecified non-institutional (private) residence as  the place of occurrence of the external cause: Secondary | ICD-10-CM | POA: Insufficient documentation

## 2019-07-24 DIAGNOSIS — Y939 Activity, unspecified: Secondary | ICD-10-CM | POA: Diagnosis not present

## 2019-07-24 DIAGNOSIS — Z7984 Long term (current) use of oral hypoglycemic drugs: Secondary | ICD-10-CM | POA: Insufficient documentation

## 2019-07-24 DIAGNOSIS — I10 Essential (primary) hypertension: Secondary | ICD-10-CM | POA: Diagnosis not present

## 2019-07-24 DIAGNOSIS — Y999 Unspecified external cause status: Secondary | ICD-10-CM | POA: Insufficient documentation

## 2019-07-24 DIAGNOSIS — S0003XA Contusion of scalp, initial encounter: Secondary | ICD-10-CM | POA: Insufficient documentation

## 2019-07-24 DIAGNOSIS — E119 Type 2 diabetes mellitus without complications: Secondary | ICD-10-CM | POA: Diagnosis not present

## 2019-07-24 DIAGNOSIS — I2510 Atherosclerotic heart disease of native coronary artery without angina pectoris: Secondary | ICD-10-CM | POA: Insufficient documentation

## 2019-07-24 DIAGNOSIS — W19XXXA Unspecified fall, initial encounter: Secondary | ICD-10-CM | POA: Insufficient documentation

## 2019-07-24 DIAGNOSIS — Z79899 Other long term (current) drug therapy: Secondary | ICD-10-CM | POA: Insufficient documentation

## 2019-07-24 DIAGNOSIS — Z87891 Personal history of nicotine dependence: Secondary | ICD-10-CM | POA: Diagnosis not present

## 2019-07-24 DIAGNOSIS — S0990XA Unspecified injury of head, initial encounter: Secondary | ICD-10-CM | POA: Diagnosis present

## 2019-07-24 NOTE — ED Triage Notes (Signed)
Pt brought to ED by daughter following fall at home this evening. Daughter states she heard her fall in the kitchen and noticed pt had knot on head, denies LOC. Pt not on blood thinners.

## 2019-07-25 ENCOUNTER — Emergency Department (HOSPITAL_COMMUNITY): Payer: Medicare PPO

## 2019-07-25 ENCOUNTER — Emergency Department (HOSPITAL_COMMUNITY)
Admission: EM | Admit: 2019-07-25 | Discharge: 2019-07-25 | Disposition: A | Discharge status: Home / Self Care | Payer: Medicare PPO | Attending: Emergency Medicine | Admitting: Emergency Medicine

## 2019-07-25 DIAGNOSIS — S0003XA Contusion of scalp, initial encounter: Secondary | ICD-10-CM

## 2019-07-25 DIAGNOSIS — W19XXXA Unspecified fall, initial encounter: Secondary | ICD-10-CM

## 2019-07-25 NOTE — ED Notes (Signed)
Pt evaluated by EDP prior to nursing assessment. 

## 2019-07-25 NOTE — ED Provider Notes (Signed)
[facility] EMERGENCY DEPARTMENT Provider Note   CSN: [id number] Arrival date & time: 07/24/19  2206     History   Chief Complaint Chief Complaint  Patient presents with   Fall    HPI LYANDRA MCPHAIL is a 85 y.o. female.     Patient presents to the emergency department for evaluation after a fall.  She is accompanied by her daughter.  Daughter reports that she was walking in the kitchen and likely lost her balance, fell to the ground.  Daughter was in the room with her but did not see the fall.  There was no loss of consciousness.  Patient immediately said that her head hurt after the fall.  She does not take blood thinners.  She has not had any complaints of other injury.     Past Medical History:  Diagnosis Date   Arthritis    Bilateral renal cysts 06/16/2017   CAP (community acquired pneumonia) 06/15/2017   DDD (degenerative disc disease), lumbar    Dementia (HCC)    Diabetes mellitus without complication (HCC)    Diverticulosis    Essential hypertension    Right bundle branch block 06/16/2017   RLQ abdominal pain 05/29/2013   Spinal stenosis at L4-L5 level 06/16/2017    Patient Active Problem List   Diagnosis Date Noted   Pulmonary nodule 12/04/2018   Shoulder joint pain 10/18/2018   Bilateral renal cysts 06/16/2017   Spinal stenosis at L4-L5 level 06/16/2017   Elevated troponin 06/16/2017   Hypokalemia 06/16/2017   Right bundle branch block 06/16/2017   Epigastric pain 06/15/2017   CAP (community acquired pneumonia) 06/15/2017   Essential hypertension 06/15/2017   Type 2 diabetes mellitus with hyperglycemia, without long-term current use of insulin (HCC) 06/15/2017   Nausea and vomiting in adult 06/15/2017   Volume depletion 06/15/2017   Chest pain in adult 06/29/2016    Past Surgical History:  Procedure Laterality Date   ABDOMINAL HYSTERECTOMY     INNER EAR SURGERY     "had ear surgery years ago and there's metal left in there"    LAPAROSCOPIC LYSIS OF ADHESIONS N/A 03/02/2013   Procedure: LAPAROSCOPIC LYSIS OF ADHESIONS;  Surgeon: Lazaro Arms, MD;  Location: [facility];  Service: Gynecology;  Laterality: N/A;   LAPAROSCOPIC SALPINGO OOPHERECTOMY Bilateral 03/02/2013   Procedure: LAPAROSCOPIC SALPINGO OOPHORECTOMY;  Surgeon: Lazaro Arms, MD;  Location: [facility];  Service: Gynecology;  Laterality: Bilateral;     OB History    Gravida  1   Para  1   Term      Preterm      AB      Living  1     SAB      TAB      Ectopic      Multiple      Live Births  1            Home Medications    Prior to Admission medications   Medication Sig Start Date End Date Taking? Authorizing Provider  amLODipine (NORVASC) 10 MG tablet Take 10 mg by mouth every morning.     [provider]  divalproex (DEPAKOTE) 250 MG DR tablet Take 500 mg by mouth 2 (two) times daily.    [provider]  docusate sodium (COLACE) 100 MG capsule Take 200 mg by mouth 2 (two) times daily as needed for mild constipation or moderate constipation.     [provider]  memantine (NAMENDA) 5 MG tablet  Take 5 mg by mouth 2 (two) times daily.    [provider]  metoprolol (LOPRESSOR) 50 MG tablet Take 50 mg by mouth 2 (two) times daily.    [provider]  risperiDONE (RISPERDAL) 1 MG tablet Take 1 mg by mouth 2 (two) times daily.    [provider]  simvastatin (ZOCOR) 40 MG tablet Take 20 mg by mouth every evening.    [provider]  sitaGLIPtin (JANUVIA) 50 MG tablet Take 50 mg by mouth every morning.    [provider]  traMADol (ULTRAM) 50 MG tablet Take 50 mg by mouth every 6 (six) hours as needed for pain.    [provider]  traZODone (DESYREL) 50 MG tablet Take 2 tablets at bedtime 05/29/19   Van ClinesAquino, Karen M, MD    Family History Family History  Problem Relation Age of Onset   Heart disease Father    Heart disease Mother     Social  History Social History   Tobacco Use   Smoking status: Former Smoker    Years: 30.00    Types: Cigarettes   Smokeless tobacco: Former NeurosurgeonUser    Types: Chew, Snuff  Substance Use Topics   Alcohol use: Not Currently    Comment: occasional drink   Drug use: No     Allergies   Ibuprofen   Review of Systems Review of Systems  Neurological: Positive for headaches.  All other systems reviewed and are negative.    Physical Exam Updated Vital Signs BP 127/61    Pulse 62    Temp 98.5 F (36.9 C) (Oral)    Resp 17    Ht 5\' 6"  (1.676 m)    Wt 77.1 kg    SpO2 95%    BMI 27.44 kg/m   Physical Exam Vitals signs and nursing note reviewed.  Constitutional:      General: She is not in acute distress.    Appearance: Normal appearance. She is well-developed.  HENT:     Head: Normocephalic. Contusion present.      Right Ear: Hearing normal.     Left Ear: Hearing normal.     Nose: Nose normal.  Eyes:     Conjunctiva/sclera: Conjunctivae normal.     Pupils: Pupils are equal, round, and reactive to light.  Neck:     Musculoskeletal: Normal range of motion and neck supple.  Cardiovascular:     Rate and Rhythm: Regular rhythm.     Heart sounds: S1 normal and S2 normal. No murmur. No friction rub. No gallop.   Pulmonary:     Effort: Pulmonary effort is normal. No respiratory distress.     Breath sounds: Normal breath sounds.  Chest:     Chest wall: No tenderness.  Abdominal:     General: Bowel sounds are normal.     Palpations: Abdomen is soft.     Tenderness: There is no abdominal tenderness. There is no guarding or rebound. Negative signs include Murphy's sign and McBurney's sign.     Hernia: No hernia is present.  Musculoskeletal: Normal range of motion.  Skin:    General: Skin is warm and dry.     Findings: No rash.  Neurological:     Mental Status: She is alert and oriented to person, place, and time.     GCS: GCS eye subscore is 4. GCS verbal subscore is 5. GCS motor  subscore is 6.     Cranial Nerves: No cranial nerve deficit.  Sensory: No sensory deficit.     Coordination: Coordination normal.  Psychiatric:        Speech: Speech normal.        Behavior: Behavior normal.        Thought Content: Thought content normal.      ED Treatments / Results  Labs (all labs ordered are listed, but only abnormal results are displayed) Labs Reviewed - No data to display  EKG None  Radiology Ct Head Wo Contrast  Result Date: 07/25/2019 CLINICAL DATA:  Recent fall with headaches and neck pain, initial encounter EXAM: CT HEAD WITHOUT CONTRAST CT CERVICAL SPINE WITHOUT CONTRAST TECHNIQUE: Multidetector CT imaging of the head and cervical spine was performed following the standard protocol without intravenous contrast. Multiplanar CT image reconstructions of the cervical spine were also generated. COMPARISON:  None. FINDINGS: CT HEAD FINDINGS Brain: Generalized atrophic changes are noted as well as chronic white matter ischemic change. No findings to suggest acute hemorrhage, acute infarction or space-occupying mass lesion are noted. Vascular: No hyperdense vessel or unexpected calcification. Skull: Normal. Negative for fracture or focal lesion. Sinuses/Orbits: No acute finding. Other: None. CT CERVICAL SPINE FINDINGS Alignment: Within normal limits. Skull base and vertebrae: 7 cervical segments are well visualized. Vertebral body height is well maintained. Facet hypertrophic changes are noted. Mild osteophytic changes are seen. No acute fracture or acute facet abnormality is noted. Soft tissues and spinal canal: Surrounding soft tissue structures are within normal limits. Upper chest: Visualized lung apices are within normal limits. Other: None IMPRESSION: CT of the head: Chronic atrophic and ischemic changes without acute abnormality. CT of cervical spine: Multilevel degenerative change without acute abnormality. Electronically Signed   By: Alcide CleverMark  Lukens M.D.   On:  07/25/2019 01:49   Ct Cervical Spine Wo Contrast  Result Date: 07/25/2019 CLINICAL DATA:  Recent fall with headaches and neck pain, initial encounter EXAM: CT HEAD WITHOUT CONTRAST CT CERVICAL SPINE WITHOUT CONTRAST TECHNIQUE: Multidetector CT imaging of the head and cervical spine was performed following the standard protocol without intravenous contrast. Multiplanar CT image reconstructions of the cervical spine were also generated. COMPARISON:  None. FINDINGS: CT HEAD FINDINGS Brain: Generalized atrophic changes are noted as well as chronic white matter ischemic change. No findings to suggest acute hemorrhage, acute infarction or space-occupying mass lesion are noted. Vascular: No hyperdense vessel or unexpected calcification. Skull: Normal. Negative for fracture or focal lesion. Sinuses/Orbits: No acute finding. Other: None. CT CERVICAL SPINE FINDINGS Alignment: Within normal limits. Skull base and vertebrae: 7 cervical segments are well visualized. Vertebral body height is well maintained. Facet hypertrophic changes are noted. Mild osteophytic changes are seen. No acute fracture or acute facet abnormality is noted. Soft tissues and spinal canal: Surrounding soft tissue structures are within normal limits. Upper chest: Visualized lung apices are within normal limits. Other: None IMPRESSION: CT of the head: Chronic atrophic and ischemic changes without acute abnormality. CT of cervical spine: Multilevel degenerative change without acute abnormality. Electronically Signed   By: Alcide CleverMark  Lukens M.D.   On: 07/25/2019 01:49    Procedures Procedures (including critical care time)  Medications Ordered in ED Medications - No data to display   Initial Impression / Assessment and Plan / ED Course  I have reviewed the triage vital signs and the nursing notes.  Pertinent labs & imaging results that were available during my care of the patient were reviewed by me and considered in my medical decision making  (see chart for details).  Patient brought to the emergency department by her daughter after a fall.  She lives with her daughter who reports that the patient was walking in the kitchen when she fell.  There was no loss of consciousness.  She has a large bump on her head and daughter was concerned of the possibility of head injury.  CT head and cervical spine have been performed and are negative for injury.  Careful examination does not reveal any other concerns of injury from the fall.  Final Clinical Impressions(s) / ED Diagnoses   Final diagnoses:  Fall, initial encounter  Contusion of scalp, initial encounter    ED Discharge Orders    None       Hartley Wyke, Gwenyth Allegra, MD 07/25/19 0159

## 2019-09-10 DEATH — deceased

## 2020-01-03 ENCOUNTER — Ambulatory Visit: Payer: Medicare PPO | Admitting: Neurology
